# Patient Record
Sex: Female | Born: 1975 | Race: White | Hispanic: No | Smoking: Former smoker
Health system: Southern US, Academic
[De-identification: ages and names within clinical notes are randomized; demographics above are authoritative.]

## PROBLEM LIST (undated history)

## (undated) DIAGNOSIS — F419 Anxiety disorder, unspecified: Secondary | ICD-10-CM

## (undated) DIAGNOSIS — E7800 Pure hypercholesterolemia, unspecified: Secondary | ICD-10-CM

## (undated) DIAGNOSIS — E119 Type 2 diabetes mellitus without complications: Secondary | ICD-10-CM

## (undated) DIAGNOSIS — G4730 Sleep apnea, unspecified: Secondary | ICD-10-CM

## (undated) DIAGNOSIS — E781 Pure hyperglyceridemia: Secondary | ICD-10-CM

## (undated) DIAGNOSIS — M545 Low back pain, unspecified: Secondary | ICD-10-CM

## (undated) DIAGNOSIS — F319 Bipolar disorder, unspecified: Secondary | ICD-10-CM

## (undated) DIAGNOSIS — I10 Essential (primary) hypertension: Secondary | ICD-10-CM

## (undated) DIAGNOSIS — F32A Depression, unspecified: Secondary | ICD-10-CM

## (undated) DIAGNOSIS — K219 Gastro-esophageal reflux disease without esophagitis: Secondary | ICD-10-CM

## (undated) DIAGNOSIS — K5900 Constipation, unspecified: Secondary | ICD-10-CM

## (undated) HISTORY — PX: HX COLONOSCOPY: 2100001147

## (undated) HISTORY — PX: HX HYSTERECTOMY: SHX81

## (undated) HISTORY — PX: ESOPHAGOGASTRODUODENOSCOPY: SHX1529

## (undated) HISTORY — PX: HX TUBAL LIGATION: SHX77

## (undated) HISTORY — DX: Anxiety disorder, unspecified: F41.9

## (undated) HISTORY — PX: HX APPENDECTOMY: SHX54

---

## 1994-09-15 ENCOUNTER — Other Ambulatory Visit (HOSPITAL_COMMUNITY): Payer: Self-pay | Admitting: EXTERNAL

## 2015-09-01 DIAGNOSIS — F32A Depression, unspecified: Secondary | ICD-10-CM | POA: Insufficient documentation

## 2016-10-11 ENCOUNTER — Other Ambulatory Visit (HOSPITAL_COMMUNITY): Payer: Self-pay

## 2020-04-30 ENCOUNTER — Other Ambulatory Visit (HOSPITAL_COMMUNITY): Payer: Self-pay

## 2020-04-30 IMAGING — MR MRI LUMBAR SPINE WITHOUT CONTRAST
6 series · 48 of 48 positions shown · IV contrast (gadolinium)
Comparison: None available.

﻿EXAM:  MRI LUMBAR SPINE WITHOUT CONTRAST
INDICATION: Lower back pain.
TECHNIQUE: Multiplanar multisequential MRI of the lumbar spine was performed without gadolinium contrast. A non conventional body coil was utilized as the patient refused standard coil.

[Series 4: T2 · coronal · 4.0mm · 1.79mm/px · 8 of 20 slices shown (1 of 3)]
[im 1/20]
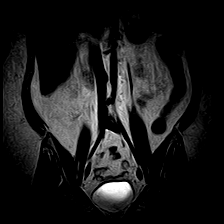
[im 3/20]
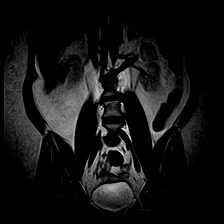
[im 6/20]
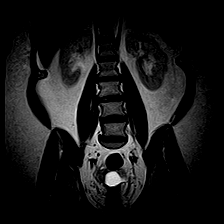
[im 9/20]
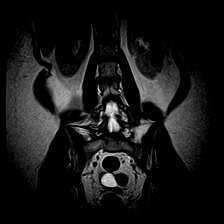
[im 11/20]
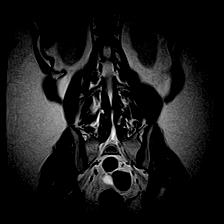
[im 14/20]
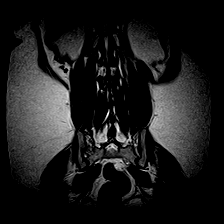
[im 17/20]
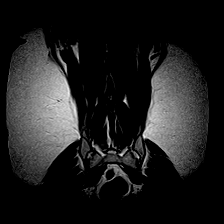
[im 20/20]
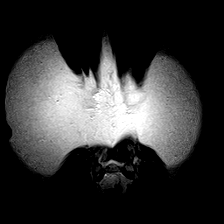

[Series 5: T2 · sagittal · 5.0mm · 1.00mm/px · 6 of 13 slices shown (2 of 3)]
[im 1/13]
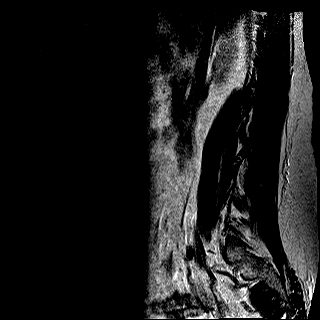
[im 3/13]
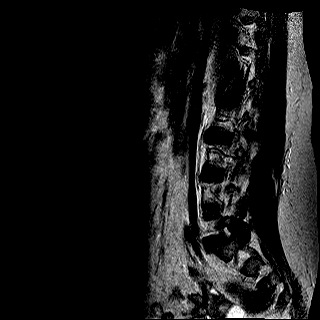
[im 5/13]
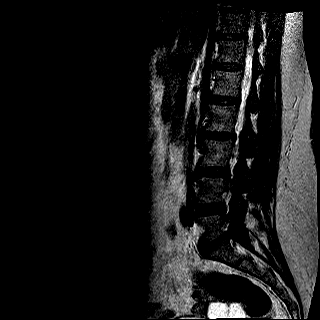
[im 8/13]
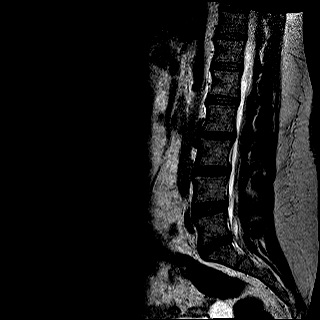
[im 10/13]
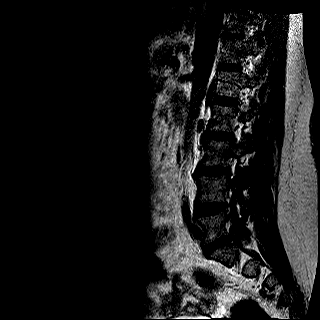
[im 13/13]
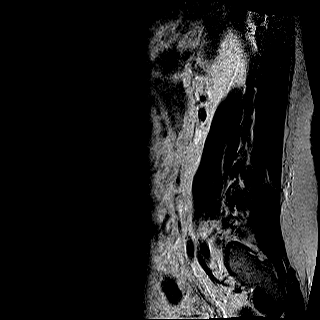

[Series 6: T1 · sagittal · 5.0mm · 1.00mm/px · 6 of 13 slices shown (1 of 2)]
[im 1/13]
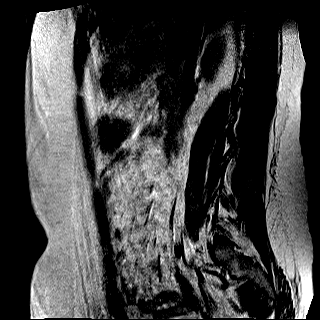
[im 3/13]
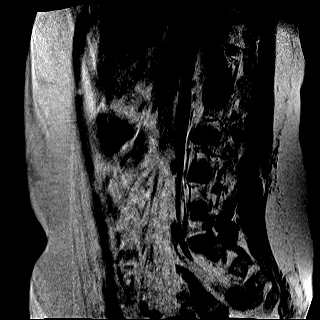
[im 5/13]
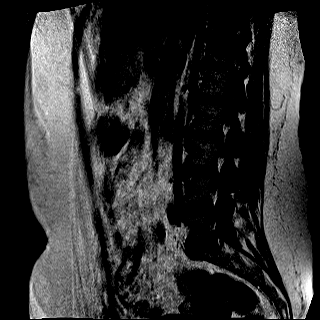
[im 8/13]
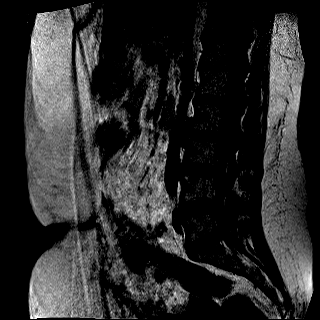
[im 10/13]
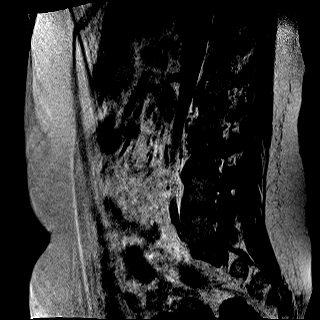
[im 13/13]
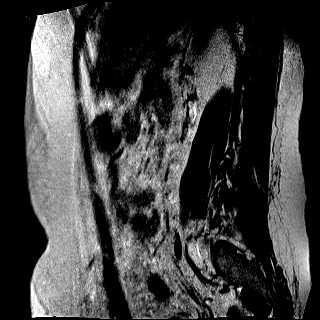

[Series 7: STIR · sagittal · 5.0mm · 1.25mm/px · 6 of 13 slices shown]
[im 1/13]
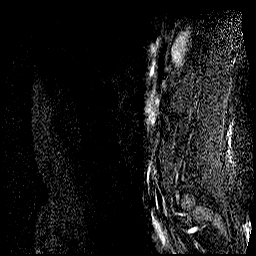
[im 3/13]
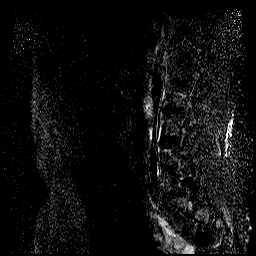
[im 5/13]
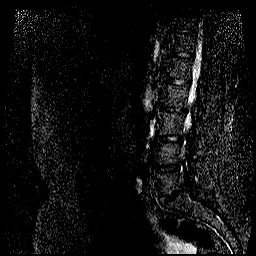
[im 8/13]
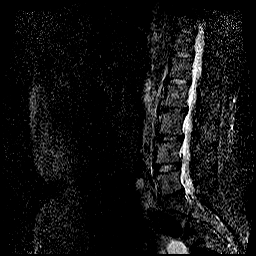
[im 10/13]
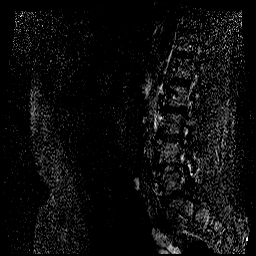
[im 13/13]
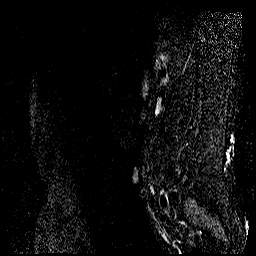

[Series 8: T2 · axial · 5.0mm · 0.89mm/px · z∈[-82,+110]mm · 11 of 25 slices shown (3 of 3)]
[im 1/25]
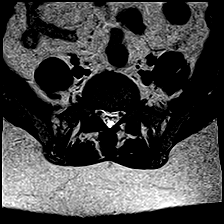
[im 3/25]
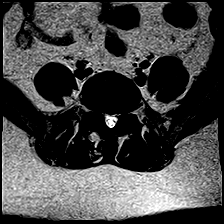
[im 5/25]
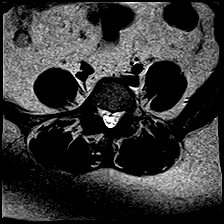
[im 8/25]
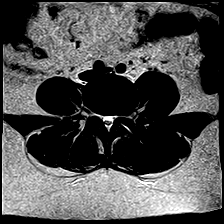
[im 10/25]
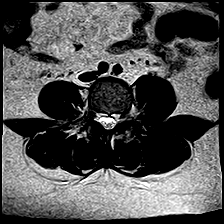
[im 13/25]
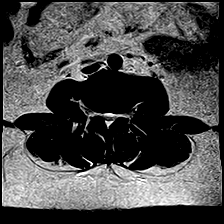
[im 15/25]
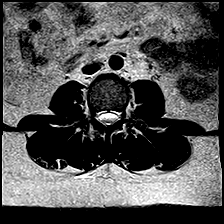
[im 17/25]
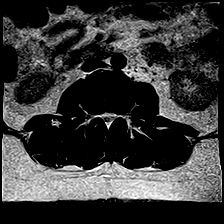
[im 20/25]
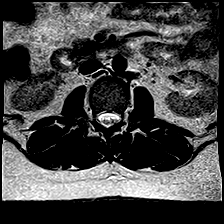
[im 22/25]
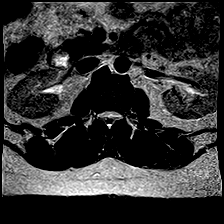
[im 25/25]
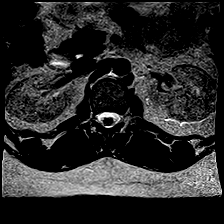

[Series 9: T1 · axial · 5.0mm · 0.89mm/px · z∈[-82,+110]mm · 11 of 25 slices shown (2 of 2)]
[im 1/25]
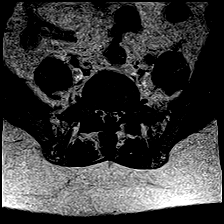
[im 3/25]
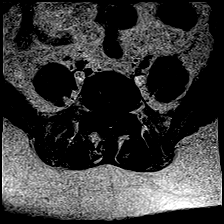
[im 5/25]
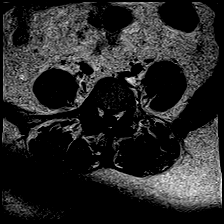
[im 8/25]
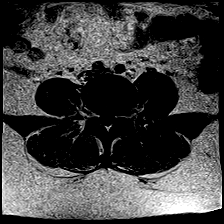
[im 10/25]
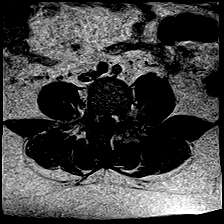
[im 13/25]
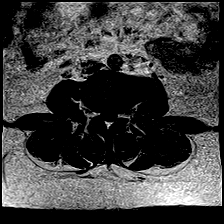
[im 15/25]
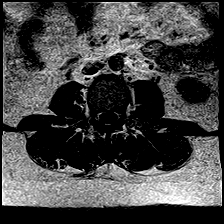
[im 17/25]
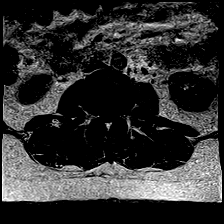
[im 20/25]
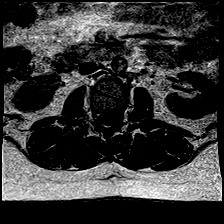
[im 22/25]
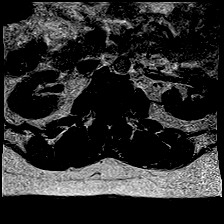
[im 25/25]
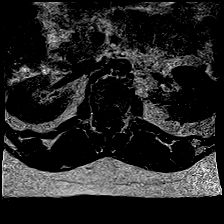

[48 of 48 positions shown; findings below may reference images not displayed]

FINDINGS: Vertebral bodies are normal in height, alignment and signal intensity. There is no acute fracture or subluxation. Distal spinal cord is normal in signal intensity and terminates normally at L1-2 disc space level. Spinal canal is congenitally narrow.

L1-2 level is unremarkable.

At L2-3 level, there is a small broad-based central disc bulge resulting in mild-to-moderate spinal stenosis. There is mild bilateral neural foraminal stenosis from facet arthropathy and bulging annulus without nerve root impingement.

At L3-4 level, there is a small broad-based central disc bulge resulting in mild spinal stenosis. There is mild bilateral neural foraminal stenosis from facet arthropathy and bulging annulus without nerve root impingement.

At L4-5 level, there is moderate left and mild right neural foraminal stenosis from facet arthropathy and bulging annulus.

At L5-S1 level, there is a minimal bulging annulus, minimally abutting the ventral thecal sac. There is moderate to severe bilateral neural foraminal stenosis from facet arthropathy and bulging annulus.

Paraspinal soft tissues are unremarkable.
IMPRESSION: 1. Mild-to-moderate spinal stenosis at L2-3 level and mild spinal stenosis at L3-4 level from small central disc bulges. 

2. Multilevel neural foraminal stenosis as detailed above.

## 2021-12-18 ENCOUNTER — Emergency Department (HOSPITAL_COMMUNITY): Payer: Medicaid Other

## 2021-12-18 ENCOUNTER — Encounter (HOSPITAL_COMMUNITY): Payer: Self-pay

## 2021-12-18 ENCOUNTER — Emergency Department
Admission: EM | Admit: 2021-12-18 | Discharge: 2021-12-18 | Disposition: A | Discharge status: Home / Self Care | Payer: Medicaid Other | Attending: Family | Admitting: Family

## 2021-12-18 ENCOUNTER — Other Ambulatory Visit: Payer: Self-pay

## 2021-12-18 DIAGNOSIS — Z6832 Body mass index (BMI) 32.0-32.9, adult: Secondary | ICD-10-CM

## 2021-12-18 DIAGNOSIS — R55 Syncope and collapse: Secondary | ICD-10-CM

## 2021-12-18 DIAGNOSIS — K5900 Constipation, unspecified: Secondary | ICD-10-CM | POA: Insufficient documentation

## 2021-12-18 DIAGNOSIS — K648 Other hemorrhoids: Secondary | ICD-10-CM | POA: Insufficient documentation

## 2021-12-18 DIAGNOSIS — R110 Nausea: Secondary | ICD-10-CM

## 2021-12-18 DIAGNOSIS — R1084 Generalized abdominal pain: Secondary | ICD-10-CM

## 2021-12-18 HISTORY — DX: Bipolar disorder, unspecified: F31.9

## 2021-12-18 HISTORY — DX: Essential (primary) hypertension: I10

## 2021-12-18 HISTORY — DX: Pure hyperglyceridemia: E78.1

## 2021-12-18 HISTORY — DX: Type 2 diabetes mellitus without complications: E11.9

## 2021-12-18 LAB — CBC WITH DIFF
BASOPHIL #: 0 10*3/uL (ref 0.00–0.30)
BASOPHIL %: 1 % (ref 0–3)
EOSINOPHIL #: 0.1 10*3/uL (ref 0.00–0.80)
EOSINOPHIL %: 1 % (ref 0–7)
HCT: 37.1 % (ref 37.0–47.0)
HGB: 12.6 g/dL (ref 12.5–16.0)
LYMPHOCYTE #: 2.4 10*3/uL (ref 1.10–5.00)
LYMPHOCYTE %: 34 % (ref 25–45)
MCH: 27.7 pg (ref 27.0–32.0)
MCHC: 34 g/dL (ref 32.0–36.0)
MCV: 81.5 fL (ref 78.0–99.0)
MONOCYTE #: 0.4 10*3/uL (ref 0.00–1.30)
MONOCYTE %: 6 % (ref 0–12)
MPV: 6.6 fL — ABNORMAL LOW (ref 7.4–10.4)
NEUTROPHIL #: 4.1 10*3/uL (ref 1.80–8.40)
NEUTROPHIL %: 59 % (ref 40–76)
PLATELETS: 333 10*3/uL (ref 140–440)
RBC: 4.55 10*6/uL (ref 4.20–5.40)
RDW: 13.6 % (ref 11.6–14.8)
WBC: 6.9 10*3/uL (ref 4.0–10.5)
WBCS UNCORRECTED: 6.9 10*3/uL

## 2021-12-18 LAB — TYPE AND SCREEN
ABO/RH(D): AB POS
ANTIBODY SCREEN: NEGATIVE

## 2021-12-18 LAB — URINALYSIS, MACROSCOPIC
BILIRUBIN: NEGATIVE mg/dL
BLOOD: NEGATIVE mg/dL
GLUCOSE: NEGATIVE mg/dL
LEUKOCYTES: NEGATIVE WBCs/uL
NITRITE: NEGATIVE
PH: 6 (ref 5.0–9.0)
PROTEIN: NEGATIVE mg/dL
SPECIFIC GRAVITY: 1.011 (ref 1.002–1.030)
UROBILINOGEN: NORMAL mg/dL

## 2021-12-18 LAB — URINALYSIS, MICROSCOPIC
HYALINE CASTS: 24 /lpf — ABNORMAL HIGH (ref <0)
RBCS: 2 /hpf (ref <4)
SQUAMOUS EPITHELIAL: 1 /hpf (ref <28)
WBCS: 1 /hpf (ref <6)

## 2021-12-18 LAB — BASIC METABOLIC PANEL
ANION GAP: 12 mmol/L (ref 10–20)
BUN/CREA RATIO: 13 (ref 6–22)
BUN: 20 mg/dL (ref 7–25)
CALCIUM: 9.2 mg/dL (ref 8.6–10.3)
CHLORIDE: 95 mmol/L — ABNORMAL LOW (ref 98–107)
CO2 TOTAL: 22 mmol/L (ref 21–31)
CREATININE: 1.56 mg/dL — ABNORMAL HIGH (ref 0.60–1.30)
ESTIMATED GFR: 41 mL/min/{1.73_m2} — ABNORMAL LOW (ref >59)
GLUCOSE: 92 mg/dL (ref 74–109)
OSMOLALITY, CALCULATED: 261 mOsm/kg — ABNORMAL LOW (ref 270–290)
POTASSIUM: 4.8 mmol/L (ref 3.5–5.1)
SODIUM: 129 mmol/L — ABNORMAL LOW (ref 136–145)

## 2021-12-18 LAB — LIPASE: LIPASE: 44 U/L (ref 11–82)

## 2021-12-18 LAB — THYROID STIMULATING HORMONE (SENSITIVE TSH): TSH: 5.071 u[IU]/mL (ref 0.450–5.330)

## 2021-12-18 LAB — LACTIC ACID LEVEL W/ REFLEX FOR LEVEL >2.0: LACTIC ACID: 0.7 mmol/L (ref 0.5–2.2)

## 2021-12-18 LAB — PT/INR
INR: 1.12 (ref <=5.00)
PROTHROMBIN TIME: 13 seconds — ABNORMAL HIGH (ref 9.8–12.7)

## 2021-12-18 MED ORDER — MAGNESIUM CITRATE ORAL SOLUTION
296.0000 mL | Freq: Once | ORAL | 0 refills | Status: AC
Start: 2021-12-18 — End: 2021-12-18

## 2021-12-18 MED ORDER — SODIUM CHLORIDE 0.9 % IV BOLUS
1000.0000 mL | INJECTION | Status: AC
Start: 2021-12-18 — End: 2021-12-18
  Administered 2021-12-18: 1000 mL via INTRAVENOUS
  Administered 2021-12-18: 0 mL via INTRAVENOUS

## 2021-12-18 NOTE — ED Triage Notes (Signed)
States syncopal episode last Friday while using the restroom, states blood in the stool on the toilet and in the bathroom, pt states she woke up on the flood.. Pt states that she has a feeling of felling full and unable to eat/use the restroom. States nausea. Reports mid abd pain.

## 2021-12-18 NOTE — ED APP Handoff Note (Signed)
Marland Kitchen     [facility]  Emergency Department  Provider in Triage Note    Name: Miranda Young  Age: 46 y.o.  Gender: female     Subjective:   Miranda Young is a 46 y.o. female who presents with complaint of Abdominal Pain  .  Patient here with complaint of diffuse abdominal pain and bloating.  Also complaining of nausea and constipation.  Reports last week she started having the rectal bleeding.  Reports bright red in color.  States she has been having intermittent bleeding ever since.  Denies any pertinent GI history or surgery.  Does report history of hysterectomy.    Objective:   Filed Vitals:    12/18/21 1531   BP: (!) 162/102   Pulse: (!) 119   Resp: 18   Temp: 37.2 C (98.9 F)   SpO2: 99%      Focused Physical Exam shows diffuse tenderness    Assessment:  A medical screening exam was completed.  This patient is a 46 y.o. female with initial findings showing abd pain    Plan:  Please see initial orders and work-up below.  This is to be continued with full evaluation in the main Emergency Department.     No current facility-administered medications for this encounter.     Results for orders placed or performed during the hospital encounter of 12/18/21 (from the past 24 hour(s))   CBC/DIFF    Narrative    The following orders were created for panel order CBC/DIFF.  Procedure                               Abnormality         Status                     ---------                               -----------         ------                     CBC WITH ASNK[[id number]]                                                                 Please view results for these tests on the individual orders.   URINALYSIS, MACROSCOPIC AND MICROSCOPIC W/CULTURE REFLEX    Specimen: Urine, Clean Catch    Narrative    The following orders were created for panel order URINALYSIS, MACROSCOPIC AND MICROSCOPIC W/CULTURE REFLEX.  Procedure                               Abnormality         Status                     ---------                                -----------         ------  URINALYSIS, MACROSCOPIC[[id number]]                                                     URINALYSIS, MICROSCOPIC[[id number]]                                                       Please view results for these tests on the individual orders.        Sherlie Ban, FNP-BC  12/18/2021, 15:35

## 2021-12-18 NOTE — ED Provider Notes (Signed)
[facility]  ED Primary Provider Note  History of Present Illness   Chief Complaint   Patient presents with   . Abdominal Pain     MALCOLM HETZ is a 46 y.o. female who had concerns including Abdominal Pain.  Arrival: The patient arrived by Car    46 year old female presents to the ER complaining abdominal pain and constipation x4 days.  Patient states she was on the toilet bearing down due to constipation when she passed out and noticed bright red blood on the toilet.  Patient saw her primary care provider and they describe her Linzess without relief.  Patient states she had 1 hard bowel movements since then and is now complaining of abdominal fullness and inability to have bowel movement.  Patient states she is able to pass gas and denies localized abdominal pain.        Review of Systems   Pertinent positive and negative ROS as per HPI.  Historical Data   History Reviewed This Encounter:      Physical Exam   ED Triage Vitals [12/18/21 1531]   BP (Non-Invasive) (!) 162/102   Heart Rate (!) 119   Respiratory Rate 18   Temperature 37.2 C (98.9 F)   SpO2 99 %   Weight 89.4 kg (197 lb)   Height 1.651 m ('5\' 5"'$ )     Physical Exam  Vitals and nursing note reviewed.   Constitutional:       General: She is not in acute distress.     Appearance: She is well-developed.   HENT:      Head: Normocephalic and atraumatic.   Eyes:      Conjunctiva/sclera: Conjunctivae normal.   Cardiovascular:      Rate and Rhythm: Normal rate and regular rhythm.      Heart sounds: No murmur heard.  Pulmonary:      Effort: Pulmonary effort is normal. No respiratory distress.      Breath sounds: Normal breath sounds.   Abdominal:      Palpations: Abdomen is soft.      Tenderness: There is no abdominal tenderness.   Musculoskeletal:         General: No swelling.      Cervical back: Neck supple.   Skin:     General: Skin is warm and dry.      Capillary Refill: Capillary refill takes less than 2 seconds.    Neurological:      Mental Status: She is alert.   Psychiatric:         Mood and Affect: Mood normal.       Patient Data     Labs Ordered/Reviewed   BASIC METABOLIC PANEL - Abnormal; Notable for the following components:       Result Value    SODIUM 129 (*)     CHLORIDE 95 (*)     CREATININE 1.56 (*)     ESTIMATED GFR 41 (*)     OSMOLALITY, CALCULATED 261 (*)     All other components within normal limits    Narrative:     Estimated Glomerular Filtration Rate (eGFR) is calculated using the CKD-EPI (2021) equation, intended for patients 42 years of age and older. If gender is not documented or "unknown", there will be no eGFR calculation.   PT/INR - Abnormal; Notable for the following components:    PROTHROMBIN TIME 13.0 (*)     All other components within normal limits    Narrative:  INR OF 2.0-3.0  RECOMMENDED FOR: PROPHYLAXIS/TREATMENT OF VENEOUS THROMBOSIS, PULMONARY EMBOLISM, PREVENTION OF SYSTEMIC EMBOLISM FROM ATRIAL FIBRILATION, MYOCARDIAL INFARCTION.    INR OF 2.5-3.5  RECOMMENDED FOR MECHANICAL PROSTHETIC HEART VALVES, RECURRENT SYSTEMIC EMBOLISM, RECURRENT MYOCARDIAL INFARCTION.     CBC WITH DIFF - Abnormal; Notable for the following components:    MPV 6.6 (*)     All other components within normal limits   URINALYSIS, MICROSCOPIC - Abnormal; Notable for the following components:    BACTERIA Rare (*)     MUCOUS Rare (*)     HYALINE CASTS 24 (*)     All other components within normal limits    Narrative:     Urine Culture Recommended   LACTIC ACID LEVEL W/ REFLEX FOR LEVEL >2.0 - Normal   LIPASE - Normal   THYROID STIMULATING HORMONE (SENSITIVE TSH) - Normal   URINALYSIS, MACROSCOPIC - Normal    Narrative:     Urine Culture Recommended   OCCULT BLOOD (PRN ED USE ONLY)   CBC/DIFF    Narrative:     The following orders were created for panel order CBC/DIFF.  Procedure                               Abnormality         Status                     ---------                               -----------          ------                     CBC WITH MAUQ[[id number]]                Abnormal            Final result                 Please view results for these tests on the individual orders.   URINALYSIS, MACROSCOPIC AND MICROSCOPIC W/CULTURE REFLEX    Narrative:     The following orders were created for panel order URINALYSIS, MACROSCOPIC AND MICROSCOPIC W/CULTURE REFLEX.  Procedure                               Abnormality         Status                     ---------                               -----------         ------                     URINALYSIS, MACROSCOPIC[[id number]]      Normal              Final result               URINALYSIS, MICROSCOPIC[[id number]]      Abnormal            Final result  Please view results for these tests on the individual orders.   TYPE AND SCREEN     CT ABDOMEN PELVIS WO IV CONTRAST   Final Result by Edi, Radresults In (04/20 1634)   Impression: No acute abnormality suggested. Minor findings as above.         One or more dose reduction techniques were used (e.g., Automated exposure control, adjustment of the mA and/or kV according to patient size, use of iterative reconstruction technique).         Radiologist location ID: [place] [facility]        Medical Decision Making  46 year old female presents to the ER complaining abdominal pain and constipation x4 days.  Patient states she was on the toilet bearing down due to constipation when she passed out and noticed bright red blood on the toilet.  Patient saw her primary care provider and they describe her Linzess without relief.  Patient states she had 1 hard bowel movements since then and is now complaining of abdominal fullness and inability to have bowel movement.  Patient states she is able to pass gas and denies localized abdominal pain.  See physical exam.  Rectal exam included 1 external 1 internal hemorrhoid without bleeding.  Upon review of CT and labs, no acute abnormalities noted other than  low sodium.  Patient had it replaced with a L of normal saline patient denies abdominal pain at this time.  Patient discharged with prescription for Mag citrate and instructed how to use enemas at home.  Patient to return if worsening of symptoms.    Amount and/or Complexity of Data Reviewed  Labs: ordered.      Risk  OTC drugs.               Medications Administered in the ED   NS bolus infusion 1,000 mL (1,000 mL Intravenous New Bag/New Syringe 12/18/21 1751)     Clinical Impression   Constipation, unspecified constipation type (Primary)       Disposition: Discharged

## 2021-12-18 NOTE — ED Nurses Note (Signed)
Patient discharged home.  AVS reviewed with patient.  A written copy of the AVS and discharge instructions was given to the patient.  Questions sufficiently answered as needed.  Patient encouraged to follow up with PCP as indicated.  In the event of an emergency, patient/care giver instructed to call 911 or go to the nearest emergency room. IV removed and pressure dressing applied. Pt left department ambulatory.

## 2021-12-29 ENCOUNTER — Other Ambulatory Visit: Payer: Self-pay

## 2022-01-16 ENCOUNTER — Encounter (INDEPENDENT_AMBULATORY_CARE_PROVIDER_SITE_OTHER): Payer: Self-pay | Admitting: Surgery

## 2022-01-22 ENCOUNTER — Encounter (INDEPENDENT_AMBULATORY_CARE_PROVIDER_SITE_OTHER): Payer: Self-pay | Admitting: Surgery

## 2022-01-22 ENCOUNTER — Other Ambulatory Visit: Payer: Self-pay

## 2022-01-22 ENCOUNTER — Ambulatory Visit (INDEPENDENT_AMBULATORY_CARE_PROVIDER_SITE_OTHER): Payer: Medicaid Other | Admitting: Surgery

## 2022-01-22 VITALS — BP 137/83 | HR 87 | Temp 97.1°F | Ht 65.0 in | Wt 197.6 lb

## 2022-01-22 DIAGNOSIS — K219 Gastro-esophageal reflux disease without esophagitis: Secondary | ICD-10-CM

## 2022-01-22 DIAGNOSIS — K5900 Constipation, unspecified: Secondary | ICD-10-CM

## 2022-01-22 DIAGNOSIS — R112 Nausea with vomiting, unspecified: Secondary | ICD-10-CM

## 2022-01-22 DIAGNOSIS — R1013 Epigastric pain: Secondary | ICD-10-CM

## 2022-01-22 DIAGNOSIS — Z6832 Body mass index (BMI) 32.0-32.9, adult: Secondary | ICD-10-CM

## 2022-01-22 DIAGNOSIS — R194 Change in bowel habit: Secondary | ICD-10-CM

## 2022-01-22 MED ORDER — PEG 3350-ELECTROLYTES 236 GRAM-22.74 GRAM-6.74 GRAM-5.86 GRAM SOLUTION
4.0000 L | Freq: Once | ORAL | 0 refills | Status: DC
Start: 2022-01-22 — End: 2022-02-10

## 2022-01-24 ENCOUNTER — Encounter (INDEPENDENT_AMBULATORY_CARE_PROVIDER_SITE_OTHER): Payer: Self-pay | Admitting: Surgery

## 2022-01-24 NOTE — H&P (View-Only) (Signed)
Office History and Physical      Reason for Visit: Abdominal Pain and Nausea    History of Present Illness  Miranda Young presents as a referral by Chauncy Lean, FNP-C for evaluation of Abdominal pain with significant constipation and associated nausea with vomiting with a change in bowel habits. Symptoms have been worse over the last month or so. Describes an episode of 15 days without a bowel movement. There was associated nausea with vomiting. No improvement using Linzess with a described unremarkable CT scan of the abdomen and pelvis. No previous endoscopic workup. No change in medications. No unintentional weight loss.     I have reviewed the patient's provided medical records and diagnostic testing including laboratory values, imaging results, documented encounters and providers notes with all pertinent information noted with respect to today's evaluation serving as unique tests and sources as a component of the medical decision making process for this encounter relevant to the patients independent evaluation by me today.        Patient Data  Patient History  Past Medical History:   Diagnosis Date   . Anxiety    . Bipolar disorder, unspecified (CMS [place])    . Diabetes mellitus, type 2 (CMS [facility])    . High triglycerides    . HTN (hypertension)          Past Surgical History:   Procedure Laterality Date   . HX APPENDECTOMY     . HX HYSTERECTOMY     . HX TUBAL LIGATION           Current Outpatient Medications   Medication Sig   . ACCU-CHEK GUIDE TEST STRIPS Does not apply Strip CHECK BLOOD SUGAR ONCE A DAY AND AS NEEDED   . atorvastatin (LIPITOR) 40 mg Oral Tablet Take 1 Tablet (40 mg total) by mouth Every evening   . busPIRone (BUSPAR) 15 mg Oral Tablet Take 1 Tablet (15 mg total) by mouth Three times a day   . ezetimibe (ZETIA) 10 mg Oral Tablet Take 1 Tablet (10 mg total) by mouth Every evening   . hydroCHLOROthiazide (HYDRODIURIL) 25 mg Oral Tablet Take 1 Tablet (25 mg total) by mouth Once a day   .  hydrOXYzine HCL (ATARAX) 50 mg Oral Tablet Take 1 Tablet (50 mg total) by mouth Four times a day   . hydrOXYzine pamoate (VISTARIL) 50 mg Oral Capsule Take 1 Capsule (50 mg total) by mouth Four times a day   . Lisinopril (PRINIVIL) 30 mg Oral Tablet Take 1 Tablet (30 mg total) by mouth Once a day   . LORazepam (ATIVAN) 1 mg Oral Tablet Take 1 Tablet (1 mg total) by mouth Once per day as needed for Anxiety   . metFORMIN (GLUCOPHAGE) 500 mg Oral Tablet Take 1 Tablet (500 mg total) by mouth Every morning with breakfast   . metoprolol succinate (TOPROL-XL) 25 mg Oral Tablet Sustained Release 24 hr Take 1 Tablet (25 mg total) by mouth Once a day   . metoprolol tartrate (LOPRESSOR) 25 mg Oral Tablet Take 1 Tablet (25 mg total) by mouth Once a day   . Mirtazapine (REMERON) 45 mg Oral Tablet Take 1 Tablet (45 mg total) by mouth Every night   . omeprazole (PRILOSEC) 20 mg Oral Capsule, Delayed Release(E.C.) Take 1 Capsule (20 mg total) by mouth Once a day   . ondansetron (ZOFRAN) 4 mg Oral Tablet Take 1 Tablet (4 mg total) by mouth Once per day as needed   . OXcarbazepine (  TRILEPTAL) 300 mg Oral Tablet Take 1 Tablet (300 mg total) by mouth Three times a day   . polyethylene glycol (MIRALAX) 17 gram/dose Oral Powder Take by mouth Once a day   . QUEtiapine (SEROQUEL) 400 mg Oral Tablet Take 1 Tablet (400 mg total) by mouth Every night   . QUEtiapine (SEROQUEL) 50 mg Oral Tablet Take 1 Tablet (50 mg total) by mouth Twice daily     Allergies   Allergen Reactions   . Amoxicillin    . Iron Nausea/ Vomiting     Family Medical History:    None         Social History     Tobacco Use   . Smoking status: Former     Types: Cigarettes   . Smokeless tobacco: Never   Substance Use Topics   . Alcohol use: Not Currently   . Drug use: Not Currently        The above documented section regarding past medical, past surgical, family, and social history ([place]) has been reviewed and considered and to the best of my knowledge represents a valid and  accurate reflection of the patient's previous pertinent experiences documented by multiple providers and participants of the EMR.I cannot attest to all entries but do no recognize any gross inaccuracies as the data is a common field across all providers  Further history pertinent to the current encounter will be found as referenced       Physical Examination:    Vitals:    01/22/22 0828   BP: 137/83   Pulse: 87   Temp: 36.2 C (97.1 F)   SpO2: 98%   Weight: 89.6 kg (197 lb 9.6 oz)   Height: 1.651 m (5\' 5" )   BMI: 32.95      General: appropriate for age. in no acute distress.    HEENT: Atraumatic, Normocephalic.    Lungs: Nonlabored breathing with symmetric expansion    Heart:Regular wth respect to rate and rythmn.    Abdomen:Soft. Nontender. Nondistended     Psychiatric: Alert and oriented to person, place, and time. affect appropriate    Skin: No rashes or obvious skin lesions         Diagnosis:    ICD-10-CM    1. Epigastric pain  R10.13       2. Change in bowel habits  R19.4       3. Gastroesophageal reflux disease, unspecified whether esophagitis present  K21.9       4. Nausea and vomiting, unspecified vomiting type  R11.2       5. Constipation, unspecified constipation type  K59.00           Plan:    Discussed indications, risks and benefits of colonoscopy and upper endoscopy with the patient. Discussed the possibility of polypectomy, biopsies, and possible repeat examinations. Risks include bleeding, sedation risks, possibility of missed diagnosis of polyp or malignancy, and remote possibilities of perforation and death. All questions were answered and informed consent was clearly obtained      This note may have been partially generated using MModal Fluency Direct system, and there may be some incorrect words, spellings, and punctuation that were not noted in checking the note before saving, though effort was made to avoid such errors.    Reinaldo Meeker MD FACS RVT  [facility]

## 2022-01-24 NOTE — H&P (Signed)
Office History and Physical      Reason for Visit: Abdominal Pain and Nausea    History of Present Illness  Ms. Dasher presents as a referral by Chauncy Lean, FNP-C for evaluation of Abdominal pain with significant constipation and associated nausea with vomiting with a change in bowel habits. Symptoms have been worse over the last month or so. Describes an episode of 15 days without a bowel movement. There was associated nausea with vomiting. No improvement using Linzess with a described unremarkable CT scan of the abdomen and pelvis. No previous endoscopic workup. No change in medications. No unintentional weight loss.     I have reviewed the patient's provided medical records and diagnostic testing including laboratory values, imaging results, documented encounters and providers notes with all pertinent information noted with respect to today's evaluation serving as unique tests and sources as a component of the medical decision making process for this encounter relevant to the patients independent evaluation by me today.        Patient Data  Patient History  Past Medical History:   Diagnosis Date   . Anxiety    . Bipolar disorder, unspecified (CMS [place])    . Diabetes mellitus, type 2 (CMS [facility])    . High triglycerides    . HTN (hypertension)          Past Surgical History:   Procedure Laterality Date   . HX APPENDECTOMY     . HX HYSTERECTOMY     . HX TUBAL LIGATION           Current Outpatient Medications   Medication Sig   . ACCU-CHEK GUIDE TEST STRIPS Does not apply Strip CHECK BLOOD SUGAR ONCE A DAY AND AS NEEDED   . atorvastatin (LIPITOR) 40 mg Oral Tablet Take 1 Tablet (40 mg total) by mouth Every evening   . busPIRone (BUSPAR) 15 mg Oral Tablet Take 1 Tablet (15 mg total) by mouth Three times a day   . ezetimibe (ZETIA) 10 mg Oral Tablet Take 1 Tablet (10 mg total) by mouth Every evening   . hydroCHLOROthiazide (HYDRODIURIL) 25 mg Oral Tablet Take 1 Tablet (25 mg total) by mouth Once a day   .  hydrOXYzine HCL (ATARAX) 50 mg Oral Tablet Take 1 Tablet (50 mg total) by mouth Four times a day   . hydrOXYzine pamoate (VISTARIL) 50 mg Oral Capsule Take 1 Capsule (50 mg total) by mouth Four times a day   . Lisinopril (PRINIVIL) 30 mg Oral Tablet Take 1 Tablet (30 mg total) by mouth Once a day   . LORazepam (ATIVAN) 1 mg Oral Tablet Take 1 Tablet (1 mg total) by mouth Once per day as needed for Anxiety   . metFORMIN (GLUCOPHAGE) 500 mg Oral Tablet Take 1 Tablet (500 mg total) by mouth Every morning with breakfast   . metoprolol succinate (TOPROL-XL) 25 mg Oral Tablet Sustained Release 24 hr Take 1 Tablet (25 mg total) by mouth Once a day   . metoprolol tartrate (LOPRESSOR) 25 mg Oral Tablet Take 1 Tablet (25 mg total) by mouth Once a day   . Mirtazapine (REMERON) 45 mg Oral Tablet Take 1 Tablet (45 mg total) by mouth Every night   . omeprazole (PRILOSEC) 20 mg Oral Capsule, Delayed Release(E.C.) Take 1 Capsule (20 mg total) by mouth Once a day   . ondansetron (ZOFRAN) 4 mg Oral Tablet Take 1 Tablet (4 mg total) by mouth Once per day as needed   . OXcarbazepine (  TRILEPTAL) 300 mg Oral Tablet Take 1 Tablet (300 mg total) by mouth Three times a day   . polyethylene glycol (MIRALAX) 17 gram/dose Oral Powder Take by mouth Once a day   . QUEtiapine (SEROQUEL) 400 mg Oral Tablet Take 1 Tablet (400 mg total) by mouth Every night   . QUEtiapine (SEROQUEL) 50 mg Oral Tablet Take 1 Tablet (50 mg total) by mouth Twice daily     Allergies   Allergen Reactions   . Amoxicillin    . Iron Nausea/ Vomiting     Family Medical History:    None         Social History     Tobacco Use   . Smoking status: Former     Types: Cigarettes   . Smokeless tobacco: Never   Substance Use Topics   . Alcohol use: Not Currently   . Drug use: Not Currently        The above documented section regarding past medical, past surgical, family, and social history ([place]) has been reviewed and considered and to the best of my knowledge represents a valid and  accurate reflection of the patient's previous pertinent experiences documented by multiple providers and participants of the EMR.I cannot attest to all entries but do no recognize any gross inaccuracies as the data is a common field across all providers  Further history pertinent to the current encounter will be found as referenced       Physical Examination:    Vitals:    01/22/22 0828   BP: 137/83   Pulse: 87   Temp: 36.2 C (97.1 F)   SpO2: 98%   Weight: 89.6 kg (197 lb 9.6 oz)   Height: 1.651 m (5\' 5" )   BMI: 32.95      General: appropriate for age. in no acute distress.    HEENT: Atraumatic, Normocephalic.    Lungs: Nonlabored breathing with symmetric expansion    Heart:Regular wth respect to rate and rythmn.    Abdomen:Soft. Nontender. Nondistended     Psychiatric: Alert and oriented to person, place, and time. affect appropriate    Skin: No rashes or obvious skin lesions         Diagnosis:    ICD-10-CM    1. Epigastric pain  R10.13       2. Change in bowel habits  R19.4       3. Gastroesophageal reflux disease, unspecified whether esophagitis present  K21.9       4. Nausea and vomiting, unspecified vomiting type  R11.2       5. Constipation, unspecified constipation type  K59.00           Plan:    Discussed indications, risks and benefits of colonoscopy and upper endoscopy with the patient. Discussed the possibility of polypectomy, biopsies, and possible repeat examinations. Risks include bleeding, sedation risks, possibility of missed diagnosis of polyp or malignancy, and remote possibilities of perforation and death. All questions were answered and informed consent was clearly obtained      This note may have been partially generated using MModal Fluency Direct system, and there may be some incorrect words, spellings, and punctuation that were not noted in checking the note before saving, though effort was made to avoid such errors.    Reinaldo Meeker MD FACS RVT  [facility]

## 2022-02-10 ENCOUNTER — Encounter (HOSPITAL_COMMUNITY): Disposition: A | Discharge status: Home / Self Care | Payer: Self-pay | Source: Home / Self Care | Attending: Surgery

## 2022-02-10 ENCOUNTER — Ambulatory Visit (HOSPITAL_COMMUNITY): Payer: Medicaid Other | Admitting: Certified Registered"

## 2022-02-10 ENCOUNTER — Other Ambulatory Visit: Payer: Self-pay

## 2022-02-10 ENCOUNTER — Ambulatory Visit (HOSPITAL_COMMUNITY): Payer: Medicaid Other | Admitting: Surgery

## 2022-02-10 ENCOUNTER — Encounter (HOSPITAL_COMMUNITY): Payer: Self-pay | Admitting: Surgery

## 2022-02-10 ENCOUNTER — Ambulatory Visit
Admit: 2022-02-10 | Discharge: 2022-02-10 | Disposition: A | Discharge status: Home / Self Care | Payer: Medicaid Other | Attending: Surgery | Admitting: Surgery

## 2022-02-10 DIAGNOSIS — K319 Disease of stomach and duodenum, unspecified: Secondary | ICD-10-CM | POA: Insufficient documentation

## 2022-02-10 DIAGNOSIS — K5730 Diverticulosis of large intestine without perforation or abscess without bleeding: Secondary | ICD-10-CM | POA: Insufficient documentation

## 2022-02-10 DIAGNOSIS — R110 Nausea: Secondary | ICD-10-CM | POA: Insufficient documentation

## 2022-02-10 DIAGNOSIS — R1013 Epigastric pain: Secondary | ICD-10-CM | POA: Insufficient documentation

## 2022-02-10 DIAGNOSIS — K449 Diaphragmatic hernia without obstruction or gangrene: Secondary | ICD-10-CM | POA: Insufficient documentation

## 2022-02-10 DIAGNOSIS — K2970 Gastritis, unspecified, without bleeding: Secondary | ICD-10-CM | POA: Insufficient documentation

## 2022-02-10 DIAGNOSIS — K5900 Constipation, unspecified: Secondary | ICD-10-CM | POA: Insufficient documentation

## 2022-02-10 DIAGNOSIS — K648 Other hemorrhoids: Secondary | ICD-10-CM | POA: Insufficient documentation

## 2022-02-10 DIAGNOSIS — K219 Gastro-esophageal reflux disease without esophagitis: Secondary | ICD-10-CM | POA: Insufficient documentation

## 2022-02-10 HISTORY — DX: Low back pain, unspecified: M54.50

## 2022-02-10 HISTORY — DX: Gastro-esophageal reflux disease without esophagitis: K21.9

## 2022-02-10 HISTORY — DX: Sleep apnea, unspecified: G47.30

## 2022-02-10 HISTORY — DX: Constipation, unspecified: K59.00

## 2022-02-10 SURGERY — GASTROSCOPY WITH BIOPSY
Anesthesia: General | Wound class: Clean Contaminated Wounds-The respiratory, GI, Genital, or urinary

## 2022-02-10 MED ORDER — LIDOCAINE (PF) 100 MG/5 ML (2 %) INTRAVENOUS SYRINGE
INJECTION | Freq: Once | INTRAVENOUS | Status: DC | PRN
Start: 2022-02-10 — End: 2022-02-10
  Administered 2022-02-10: 100 mg via INTRAVENOUS

## 2022-02-10 MED ORDER — LACTATED RINGERS INTRAVENOUS SOLUTION
INTRAVENOUS | Status: DC
Start: 2022-02-10 — End: 2022-02-10

## 2022-02-10 MED ORDER — PROPOFOL 10 MG/ML INTRAVENOUS EMULSION
Freq: Once | INTRAVENOUS | Status: DC | PRN
Start: 2022-02-10 — End: 2022-02-10
  Administered 2022-02-10: 42 mL via INTRAVENOUS

## 2022-02-10 SURGICAL SUPPLY — 5 items
FORCEPS BIOPSY MICROMESH TTH STREAMLINE CATH NEEDLE 240CM 2.4MM RJ 4 SS LRG CPC STRL DISP ORNG 2.8MM (ENDOSCOPIC SUPPLIES) ×1 IMPLANT
FORCEPS BIOPSY NEEDLE 240CM 2._4MM RJ 4 2.8MM LRG CPC STRL (INSTRUMENTS ENDOMECHANICAL) ×1
USE ITEM 60432 FORCEPS BIOPSY MICROMESH TTH STREAMLINE CATH NEEDLE 240CM 2.4MM RJ 4 SS LRG CPC STRL DISP ORNG 2.8MM (ENDOSCOPIC SUPPLIES) ×1 IMPLANT
VALVE AIR/H20 DEFENDO BUTTON KIT SUCT BIOPSY STRL DISP (ENDOSCOPIC SUPPLIES) ×1 IMPLANT
VALVE AIR/H20 DEFENDO BUTTON KIT SUCT BIOPSY STRL DISP (INSTRUMENTS ENDOMECHANICAL) ×1

## 2022-02-10 NOTE — Interval H&P Note (Signed)
[facility]      H&P UPDATE FORM                                                                                  Halle, Davlin, 46 y.o. female  Date of Admission:  02/10/2022  Date of Birth:  20-May-1976    02/10/2022    STOP: IF H&P IS GREATER THAN 30 DAYS FROM SURGICAL DAY COMPLETE NEW H&P IS REQUIRED.     H & P updated the day of the procedure.  1.  H&P completed within 30 days of surgical procedure and has been reviewed within 24 hours of admission but prior to surgery or a procedure requiring anesthesia services, the patient has been examined, and no change has occured in the patients condition since the H&P was completed.       Change in medications: No        No LMP recorded.      Comments:     2.  Patient continues to be appropriate candidate for planned surgical procedure. YES    Clide Dales, MD

## 2022-02-10 NOTE — OR Surgeon (Signed)
[facility]    OPERATIVE NOTE    Patient Name: Miranda Young, Citron [facility] MRN:: [id number]  Date of Birth: [birth year]  Date of Service: 02/10/2022     Pre-Operative Diagnosis: NAUSEA  ABD PAIN  CONSTIPATION     Post-Operative Diagnosis: Gastritis  Small Hiatal Hernia  Diverticulosis  Hemorrhoids    Procedure(s)/Description:  EGD  WITH BIOPSY: 43239 (CPT)  COLONOSCOPY: 82800 (CPT)     Attending Surgeon: Clide Dales, MD     Anesthesia Staff:  CRNA: Welton Flakes, CRNA    Anesthesia Type: .General     Estimated Blood Loss:  minimal    Specimens Removed:   ID Type Source Tests Collected by Time Destination   1 : gastric antrum bx x2 Tissue Gastric SURGICAL PATHOLOGY SPECIMEN Clide Dales, MD 02/10/2022 1217       Order Name Source Comment Collection Info Order Time   SURGICAL PATHOLOGY SPECIMEN Gastric Pre-op diagnosis:  NAUSEA  ABD PAIN  CONSTIPATION Collected By: Clide Dales, MD 02/10/2022 12:17 PM     Release to patient   Automated             Complications:  None    Condition:  Stable    Disposition:   PACU - hemodynamically stable.        Intraoperative Findings:     The Olympus gastroscope was brought to the operating field gently inserted into the patient's oropharynx and advanced to the level of the second and third portions of the duodenum under direct visualization without difficulty.  Once this anatomic landmark was reached the scope was slowly retracted with circumferential visualization of all upper intestinal walls with findings of gastritis and a small hiatal hernia.  There was no evidence of gastric or duodenal ulcers, polyps, or AV malformations.  Retroflexion of the scope revealed a small hiatal hernia.  The distal esophagus revealed no significant esophagitis with an intact Z line and no evidence of Barrett's changes.  The remainder of the esophagus was within normal limits without evidence of esophageal stricture or mass.  No esophageal varices.         Once the upper  endoscopy was performed the Olympus colonoscope was brought to the operating field gently inserted into the patient's rectum and advanced to the level of the cecum under direct visualization without difficulty.  Once this anatomic landmark was reached the scope was slowly retracted with circumferential visualization of all colonic and rectal walls with findings of internal hemorrhoids and sigmoid diverticulosis.  There was no evidence of colonic or rectal polyps, tumors or AV malformations.   The colonic and rectal mucosa revealed no significant abnormalities where visualized.  Retroflexion of the scope revealed the presence of internal hemorrhoids. The prep was overall adequate for diagnosstic exam however very small abnormalities may be missed given the nature of the exam.          Description of Procedure           The patient was brought to the Operating Suite and placed in the supine position on the operating table.  Anesthesia/nursing personnel provided IV access as well as hemodynamic monitoring.  After appropriate lines and leads were placed, the patient was placed in the left lateral decubitus position where they received total IV anesthesia.   After the patient was deemed comfortable, the Olympus gastroscope was brought into the operative field, inserted into the patient's oropharynx, and advanced to the level of the esophagus where the esophagus  was intubated without difficulty.  The scope was then passed down the esophagus into the stomach.  The stomach was insufflated with air followed by identification of the antrum of the stomach.  The pylorus of the stomach was identified followed by passage of the scope through the pylorus to the second and third portions of the duodenum without difficulty.  Once this anatomic location had been reached, the scope was slowly retracted with circumferential visualization of all upper intestinal walls with findings as dictated above.  The scope was brought back to the  antrum of the stomach where antral biopsy for Helicobacter pylori was accomplished followed by retroflexion of the scope to evaluate the fundus, body, and cardia of the stomach with findings as noted.  The scope was straightened and the stomach decompressed of as much air as possible.  The scope removed back to the GE junction which was inspected thoroughly.  The scope was then slowly retracted through the remainder of the esophagus.             After completion of the upper endoscopy, attention was directed to the perineum where the Olympus colonoscope was brought to the operative field, gently inserted into the patient's rectum, and advanced to the level of the cecum under direct visualization with identification of the cecum through the use of normal anatomic landmarks.   Once the cecum was clearly identified, the scope was slowly retracted with circumferential visualization of all colonic and rectal walls with findings dictated above. In the rectum  the scope was retroflexed to evaluate the anorectal junction for the presence of hemorrhoids.  The scope was slowly straightened, the colon decompressed of as much air as possible and removed without difficulty.  The patient was returned to the post anesthesia care unit in stable condition, having tolerated the procedure well.           Disposition:     Repeat colonoscopy in 10 years barring any change in symptoms    Reinaldo Meeker MD FACS RVT  [facility]

## 2022-02-10 NOTE — Anesthesia Postprocedure Evaluation (Signed)
Anesthesia Post Op Evaluation    Patient: Miranda Young  Procedure(s):  EGD  WITH BIOPSY  COLONOSCOPY    Last Vitals:Temperature: 37.2 C (99 F) (02/10/22 1233)  Heart Rate: 97 (02/10/22 1233)  BP (Non-Invasive): (!) 146/96 (02/10/22 1233)  Respiratory Rate: 12 (02/10/22 1233)  SpO2: 100 % (02/10/22 1233)    No notable events documented.    Patient is sufficiently recovered from the effects of anesthesia to participate in the evaluation and has returned to their pre-procedure level.  Patient location during evaluation: PACU       Patient participation: complete - patient participated  Level of consciousness: awake and alert and responsive to verbal stimuli    Pain score: 0  Pain management: adequate  Airway patency: patent    Anesthetic complications: no  Cardiovascular status: acceptable  Respiratory status: acceptable  Hydration status: acceptable  Patient post-procedure temperature: Pt Normothermic   PONV Status: Absent

## 2022-02-10 NOTE — Anesthesia Preprocedure Evaluation (Signed)
ANESTHESIA PRE-OP EVALUATION  Planned Procedure: EGD  WITH BIOPSY  COLONOSCOPY  Review of Systems     anesthesia history negative     patient summary reviewed  nursing notes reviewed        Pulmonary   sleep apnea,   Cardiovascular    Hypertension, ECG reviewed and hyperlipidemia , Exercise Tolerance: > or = 4 METS   ,beta blocker therapy  ,taken in last 24 hours     GI/Hepatic/Renal    GERD        Endo/Other    obesity,   type 2 diabetes    Neuro/Psych/MS    back abnormality, anxiety, bipolar disorder     Cancer    negative hematology/oncology ROS,                   Physical Assessment      Airway       Mallampati: I    TM distance: >3 FB    Mouth Opening: good.  No Facial hair          Dental       Dentition intact             Pulmonary    Breath sounds clear to auscultation  (-) no rhonchi, no decreased breath sounds, no wheezes, no rales and no stridor     Cardiovascular             Other findings            Plan  ASA 3     Planned anesthesia type: general     total intravenous anesthesia                        Anesthetic plan and risks discussed with patient  Signed consent obtained            Patient's NPO status is appropriate for Anesthesia.

## 2022-02-10 NOTE — Discharge Instructions (Addendum)
SURGICAL DISCHARGE INSTRUCTIONS     Dr. Georgiann Cocker, Gaston Islam, MD  performed your EGD  WITH BIOPSY, COLONOSCOPY today at the [facility]:  Monday through Friday from 8 a.m. - 4 p.m.: [phone]    For T&D: [phone]  Between 4 p.m. - 8 a.m., weekends and holidays:  Call ER [phone]    PLEASE SEE WRITTEN HANDOUTS AS DISCUSSED BY YOUR NURSE:  EGD, COLONOSCOPY    ANESTHESIA INFORMATION   ANESTHESIA -- ADULT PATIENTS:  You have received intravenous sedation / general anesthesia, and you may feel drowsy and light-headed for several hours. You may even experience some forgetfulness of the procedure. DO NOT DRIVE A MOTOR VEHICLE or perform any activity requiring complete alertness or coordination until you feel fully awake in about 24-48 hours. Do not drink alcoholic beverages for at least 24 hours. Do not stay alone, you must have a responsible adult available to be with you. You may also experience a dry mouth or nausea for 24 hours. This is a normal side effect and will disappear as the effects of the medication wear off.    REMEMBER   If you experience any difficulty breathing, chest pain, bleeding that you feel is excessive, persistent nausea or vomiting or for any other concerns:  Call your physician Dr.  Georgiann Cocker, Gaston Islam, MD   at [phone] . You may also ask to have the general doctor on call paged. They are available to you 24 hours a day.      SPECIAL INSTRUCTIONS / COMMENTS   REST TODAY. NO DRIVING, WORKING, OPERATING MACHINERY, COOKING TODAY. RESUME NORMAL ACTIVITY TOMORROW.  FINDINGS TODAY:  Gastritis  Small Hiatal Hernia  Diverticulosis  Hemorrhoids    FOLLOW-UP APPOINTMENTS   Please call your surgeon's office at the number listed to schedule a date / time of return for follow-up.   REPEAT COLONOSCOPY IN 10 YEARS UNLESS NEW PROBLEMS DEVELOP.

## 2022-02-11 DIAGNOSIS — Z6832 Body mass index (BMI) 32.0-32.9, adult: Secondary | ICD-10-CM

## 2022-02-11 DIAGNOSIS — K3189 Other diseases of stomach and duodenum: Secondary | ICD-10-CM

## 2022-02-11 LAB — SURGICAL PATHOLOGY SPECIMEN

## 2022-02-19 ENCOUNTER — Other Ambulatory Visit (HOSPITAL_COMMUNITY): Payer: Self-pay | Admitting: NURSE PRACTITIONER

## 2022-02-19 DIAGNOSIS — Z13820 Encounter for screening for osteoporosis: Secondary | ICD-10-CM

## 2022-03-06 ENCOUNTER — Encounter (HOSPITAL_COMMUNITY): Payer: Self-pay

## 2022-03-06 ENCOUNTER — Other Ambulatory Visit: Payer: Self-pay

## 2022-03-06 ENCOUNTER — Ambulatory Visit
Admission: RE | Admit: 2022-03-06 | Discharge: 2022-03-06 | Disposition: A | Discharge status: Home / Self Care | Payer: Medicaid Other | Source: Ambulatory Visit | Attending: NURSE PRACTITIONER | Admitting: NURSE PRACTITIONER

## 2022-03-06 DIAGNOSIS — Z13820 Encounter for screening for osteoporosis: Secondary | ICD-10-CM | POA: Insufficient documentation

## 2022-03-06 DIAGNOSIS — Z1231 Encounter for screening mammogram for malignant neoplasm of breast: Secondary | ICD-10-CM | POA: Insufficient documentation

## 2022-03-27 ENCOUNTER — Encounter (HOSPITAL_COMMUNITY): Payer: Self-pay | Admitting: Family

## 2022-03-27 ENCOUNTER — Emergency Department (HOSPITAL_COMMUNITY): Payer: Medicaid Other

## 2022-03-27 ENCOUNTER — Other Ambulatory Visit: Payer: Self-pay

## 2022-03-27 ENCOUNTER — Inpatient Hospital Stay (HOSPITAL_COMMUNITY): Payer: Medicaid Other | Admitting: Internal Medicine

## 2022-03-27 ENCOUNTER — Inpatient Hospital Stay
Admission: EM | Admit: 2022-03-27 | Discharge: 2022-03-29 | DRG: 641 | Disposition: A | Discharge status: Home / Self Care | Payer: Medicaid Other | Attending: Internal Medicine | Admitting: Internal Medicine

## 2022-03-27 DIAGNOSIS — F410 Panic disorder [episodic paroxysmal anxiety] without agoraphobia: Secondary | ICD-10-CM

## 2022-03-27 DIAGNOSIS — R519 Headache, unspecified: Secondary | ICD-10-CM

## 2022-03-27 DIAGNOSIS — R4182 Altered mental status, unspecified: Secondary | ICD-10-CM

## 2022-03-27 DIAGNOSIS — I160 Hypertensive urgency: Secondary | ICD-10-CM | POA: Diagnosis present

## 2022-03-27 DIAGNOSIS — R112 Nausea with vomiting, unspecified: Secondary | ICD-10-CM

## 2022-03-27 DIAGNOSIS — Z7984 Long term (current) use of oral hypoglycemic drugs: Secondary | ICD-10-CM

## 2022-03-27 DIAGNOSIS — E871 Hypo-osmolality and hyponatremia: Principal | ICD-10-CM | POA: Diagnosis present

## 2022-03-27 DIAGNOSIS — R202 Paresthesia of skin: Secondary | ICD-10-CM

## 2022-03-27 DIAGNOSIS — H538 Other visual disturbances: Secondary | ICD-10-CM

## 2022-03-27 DIAGNOSIS — G4730 Sleep apnea, unspecified: Secondary | ICD-10-CM | POA: Diagnosis present

## 2022-03-27 DIAGNOSIS — M5126 Other intervertebral disc displacement, lumbar region: Secondary | ICD-10-CM | POA: Diagnosis present

## 2022-03-27 DIAGNOSIS — E782 Mixed hyperlipidemia: Secondary | ICD-10-CM

## 2022-03-27 DIAGNOSIS — R748 Abnormal levels of other serum enzymes: Secondary | ICD-10-CM | POA: Diagnosis present

## 2022-03-27 DIAGNOSIS — E119 Type 2 diabetes mellitus without complications: Secondary | ICD-10-CM | POA: Diagnosis present

## 2022-03-27 DIAGNOSIS — Z9989 Dependence on other enabling machines and devices: Secondary | ICD-10-CM

## 2022-03-27 DIAGNOSIS — R200 Anesthesia of skin: Secondary | ICD-10-CM

## 2022-03-27 DIAGNOSIS — Z87891 Personal history of nicotine dependence: Secondary | ICD-10-CM

## 2022-03-27 DIAGNOSIS — E7800 Pure hypercholesterolemia, unspecified: Secondary | ICD-10-CM | POA: Diagnosis present

## 2022-03-27 DIAGNOSIS — K219 Gastro-esophageal reflux disease without esophagitis: Secondary | ICD-10-CM

## 2022-03-27 DIAGNOSIS — F419 Anxiety disorder, unspecified: Secondary | ICD-10-CM | POA: Diagnosis present

## 2022-03-27 DIAGNOSIS — E781 Pure hyperglyceridemia: Secondary | ICD-10-CM | POA: Diagnosis present

## 2022-03-27 DIAGNOSIS — I10 Essential (primary) hypertension: Secondary | ICD-10-CM | POA: Diagnosis present

## 2022-03-27 DIAGNOSIS — F319 Bipolar disorder, unspecified: Secondary | ICD-10-CM | POA: Diagnosis present

## 2022-03-27 HISTORY — DX: Depression, unspecified: F32.A

## 2022-03-27 HISTORY — DX: Pure hypercholesterolemia, unspecified: E78.00

## 2022-03-27 LAB — URINALYSIS, MACROSCOPIC
BILIRUBIN: NEGATIVE mg/dL
BLOOD: NEGATIVE mg/dL
GLUCOSE: NEGATIVE mg/dL
KETONES: NEGATIVE mg/dL
LEUKOCYTES: NEGATIVE WBCs/uL
NITRITE: NEGATIVE
PH: 7 (ref 5.0–9.0)
PROTEIN: NEGATIVE mg/dL
SPECIFIC GRAVITY: 1.028 (ref 1.002–1.030)
UROBILINOGEN: NORMAL mg/dL

## 2022-03-27 LAB — THYROID STIMULATING HORMONE WITH FREE T4 REFLEX: TSH: 4.157 u[IU]/mL (ref 0.450–5.330)

## 2022-03-27 LAB — BASIC METABOLIC PANEL
ANION GAP: 12 mmol/L (ref 10–20)
ANION GAP: 7 mmol/L — ABNORMAL LOW (ref 10–20)
ANION GAP: 8 mmol/L — ABNORMAL LOW (ref 10–20)
ANION GAP: 8 mmol/L — ABNORMAL LOW (ref 10–20)
BUN/CREA RATIO: 10 (ref 6–22)
BUN/CREA RATIO: 11 (ref 6–22)
BUN/CREA RATIO: 10 (ref 6–22)
BUN/CREA RATIO: 11 (ref 6–22)
BUN: 11 mg/dL (ref 7–25)
BUN: 11 mg/dL (ref 7–25)
BUN: 10 mg/dL (ref 7–25)
BUN: 10 mg/dL (ref 7–25)
CALCIUM: 8.6 mg/dL (ref 8.6–10.3)
CALCIUM: 8 mg/dL — ABNORMAL LOW (ref 8.6–10.3)
CALCIUM: 8 mg/dL — ABNORMAL LOW (ref 8.6–10.3)
CALCIUM: 8.2 mg/dL — ABNORMAL LOW (ref 8.6–10.3)
CHLORIDE: 85 mmol/L — ABNORMAL LOW (ref 98–107)
CHLORIDE: 89 mmol/L — ABNORMAL LOW (ref 98–107)
CHLORIDE: 90 mmol/L — ABNORMAL LOW (ref 98–107)
CHLORIDE: 89 mmol/L — ABNORMAL LOW (ref 98–107)
CO2 TOTAL: 19 mmol/L — ABNORMAL LOW (ref 21–31)
CO2 TOTAL: 21 mmol/L (ref 21–31)
CO2 TOTAL: 21 mmol/L (ref 21–31)
CO2 TOTAL: 22 mmol/L (ref 21–31)
CREATININE: 1.09 mg/dL (ref 0.60–1.30)
CREATININE: 0.98 mg/dL (ref 0.60–1.30)
CREATININE: 0.97 mg/dL (ref 0.60–1.30)
CREATININE: 0.92 mg/dL (ref 0.60–1.30)
ESTIMATED GFR: 63 mL/min/{1.73_m2} (ref >59)
ESTIMATED GFR: 72 mL/min/{1.73_m2} (ref >59)
ESTIMATED GFR: 73 mL/min/{1.73_m2} (ref >59)
ESTIMATED GFR: 77 mL/min/{1.73_m2} (ref >59)
GLUCOSE: 126 mg/dL — ABNORMAL HIGH (ref 74–109)
GLUCOSE: 108 mg/dL (ref 74–109)
GLUCOSE: 108 mg/dL (ref 74–109)
GLUCOSE: 106 mg/dL (ref 74–109)
OSMOLALITY, CALCULATED: 236 mOsm/kg — ABNORMAL LOW (ref 270–290)
OSMOLALITY, CALCULATED: 237 mOsm/kg — ABNORMAL LOW (ref 270–290)
OSMOLALITY, CALCULATED: 240 mOsm/kg — ABNORMAL LOW (ref 270–290)
OSMOLALITY, CALCULATED: 240 mOsm/kg — ABNORMAL LOW (ref 270–290)
POTASSIUM: 4.3 mmol/L (ref 3.5–5.1)
POTASSIUM: 4.4 mmol/L (ref 3.5–5.1)
POTASSIUM: 4 mmol/L (ref 3.5–5.1)
POTASSIUM: 3.8 mmol/L (ref 3.5–5.1)
SODIUM: 116 mmol/L — CL (ref 136–145)
SODIUM: 117 mmol/L — CL (ref 136–145)
SODIUM: 119 mmol/L — CL (ref 136–145)
SODIUM: 119 mmol/L — CL (ref 136–145)

## 2022-03-27 LAB — COMPREHENSIVE METABOLIC PANEL, NON-FASTING
ALBUMIN/GLOBULIN RATIO: 1.8 — ABNORMAL HIGH (ref 0.8–1.4)
ALBUMIN: 4.3 g/dL (ref 3.5–5.7)
ALKALINE PHOSPHATASE: 90 U/L (ref 34–104)
ALT (SGPT): 18 U/L (ref 7–52)
ANION GAP: 8 mmol/L — ABNORMAL LOW (ref 10–20)
AST (SGOT): 19 U/L (ref 13–39)
BILIRUBIN TOTAL: 0.3 mg/dL (ref 0.3–1.2)
BUN/CREA RATIO: 10 (ref 6–22)
BUN: 11 mg/dL (ref 7–25)
CALCIUM, CORRECTED: 8.3 mg/dL — ABNORMAL LOW (ref 8.9–10.8)
CALCIUM: 8.6 mg/dL (ref 8.6–10.3)
CHLORIDE: 87 mmol/L — ABNORMAL LOW (ref 98–107)
CO2 TOTAL: 23 mmol/L (ref 21–31)
CREATININE: 1.05 mg/dL (ref 0.60–1.30)
ESTIMATED GFR: 66 mL/min/{1.73_m2} (ref >59)
GLOBULIN: 2.4 — ABNORMAL LOW (ref 2.9–5.4)
GLUCOSE: 130 mg/dL — ABNORMAL HIGH (ref 74–109)
OSMOLALITY, CALCULATED: 240 mOsm/kg — ABNORMAL LOW (ref 270–290)
POTASSIUM: 4.3 mmol/L (ref 3.5–5.1)
PROTEIN TOTAL: 6.7 g/dL (ref 6.4–8.9)
SODIUM: 118 mmol/L — CL (ref 136–145)

## 2022-03-27 LAB — CBC WITH DIFF
BASOPHIL #: 0 10*3/uL (ref 0.00–0.30)
BASOPHIL %: 1 % (ref 0–3)
EOSINOPHIL #: 0.1 10*3/uL (ref 0.00–0.80)
EOSINOPHIL %: 1 % (ref 0–7)
HCT: 35.1 % — ABNORMAL LOW (ref 37.0–47.0)
HGB: 12.1 g/dL — ABNORMAL LOW (ref 12.5–16.0)
LYMPHOCYTE #: 2.1 10*3/uL (ref 1.10–5.00)
LYMPHOCYTE %: 40 % (ref 25–45)
MCH: 27 pg (ref 27.0–32.0)
MCHC: 34.5 g/dL (ref 32.0–36.0)
MCV: 78.2 fL (ref 78.0–99.0)
MONOCYTE #: 0.3 10*3/uL (ref 0.00–1.30)
MONOCYTE %: 6 % (ref 0–12)
MPV: 6.4 fL — ABNORMAL LOW (ref 7.4–10.4)
NEUTROPHIL #: 2.7 10*3/uL (ref 1.80–8.40)
NEUTROPHIL %: 52 % (ref 40–76)
PLATELETS: 325 10*3/uL (ref 140–440)
RBC: 4.49 10*6/uL (ref 4.20–5.40)
RDW: 12.4 % (ref 11.6–14.8)
WBC: 5.2 10*3/uL (ref 4.0–10.5)
WBCS UNCORRECTED: 5.2 10*3/uL

## 2022-03-27 LAB — URINE DRUG SCREEN
AMPHET QL: NEGATIVE
BARB QL: NEGATIVE
BENZO QL: NEGATIVE
BUP QL: NEGATIVE
CANNAQL: NEGATIVE
COCQL: NEGATIVE
METHQL: NEGATIVE
OPIATE: NEGATIVE
OXYCODONE URINE: NEGATIVE
PCP QL: NEGATIVE
TRICYCLIC ANTIDEPRESSANTS URINE: POSITIVE — AB

## 2022-03-27 LAB — URINALYSIS, MICROSCOPIC
HYALINE CASTS: 1 /lpf — ABNORMAL HIGH (ref <0)
RBCS: 17 /hpf — ABNORMAL HIGH (ref <4)
SQUAMOUS EPITHELIAL: 3 /hpf (ref <28)
WBCS: 1 /hpf (ref <6)

## 2022-03-27 LAB — PT/INR
INR: 1.03 (ref <=5.00)
PROTHROMBIN TIME: 12 seconds (ref 9.8–12.7)

## 2022-03-27 LAB — SODIUM, RANDOM URINE: SODIUM RANDOM URINE: 50 mmol/L (ref 40–220)

## 2022-03-27 LAB — POC BLOOD GLUCOSE (RESULTS): GLUCOSE, POC: 109 mg/dl (ref 50–500)

## 2022-03-27 LAB — SEDIMENTATION RATE: ERYTHROCYTE SEDIMENTATION RATE (ESR): 1 mm/hr (ref <20)

## 2022-03-27 LAB — AMMONIA: AMMONIA: 26 umol/L (ref 16–53)

## 2022-03-27 LAB — TROPONIN-I: TROPONIN I: 3 ng/L (ref <15)

## 2022-03-27 LAB — ETHANOL, SERUM: ETHANOL: <10 mg/dL — ABNORMAL HIGH

## 2022-03-27 LAB — CREATINE KINASE (CK), TOTAL, SERUM: CREATINE KINASE: 399 U/L — ABNORMAL HIGH (ref 30–223)

## 2022-03-27 LAB — C-REACTIVE PROTEIN (CRP): C-REACTIVE PROTEIN (CRP): 0.4 mg/dL (ref 0.1–0.5)

## 2022-03-27 LAB — LACTIC ACID LEVEL W/ REFLEX FOR LEVEL >2.0: LACTIC ACID: 1.8 mmol/L (ref 0.5–2.2)

## 2022-03-27 LAB — PTT (PARTIAL THROMBOPLASTIN TIME): APTT: 32 seconds (ref 26.0–36.0)

## 2022-03-27 LAB — MAGNESIUM: MAGNESIUM: 1.5 mg/dL — ABNORMAL LOW (ref 1.9–2.7)

## 2022-03-27 MED ORDER — LISINOPRIL 5 MG TABLET
ORAL_TABLET | ORAL | Status: AC
Start: 2022-03-27 — End: 2022-03-27
  Filled 2022-03-27: qty 2

## 2022-03-27 MED ORDER — GLUCAGON 1 MG/ML SOLUTION FOR INJECTION
1.0000 mg | INTRAMUSCULAR | Status: DC | PRN
Start: 2022-03-27 — End: 2022-03-29

## 2022-03-27 MED ORDER — ONDANSETRON HCL (PF) 4 MG/2 ML INJECTION SOLUTION
4.0000 mg | Freq: Four times a day (QID) | INTRAMUSCULAR | Status: DC | PRN
Start: 2022-03-27 — End: 2022-03-29
  Administered 2022-03-28: 4 mg via INTRAVENOUS
  Filled 2022-03-27: qty 2

## 2022-03-27 MED ORDER — DICLOFENAC SODIUM 50 MG TABLET,DELAYED RELEASE
50.0000 mg | DELAYED_RELEASE_TABLET | Freq: Three times a day (TID) | ORAL | Status: DC
Start: 2022-03-27 — End: 2022-03-29
  Administered 2022-03-27 – 2022-03-29 (×5): 50 mg via ORAL
  Filled 2022-03-27 (×8): qty 1

## 2022-03-27 MED ORDER — QUETIAPINE 100 MG TABLET
400.0000 mg | ORAL_TABLET | Freq: Every evening | ORAL | Status: DC
Start: 2022-03-27 — End: 2022-03-29
  Administered 2022-03-27 – 2022-03-28 (×2): 400 mg via ORAL
  Filled 2022-03-27: qty 4

## 2022-03-27 MED ORDER — BUSPIRONE 5 MG TABLET
15.0000 mg | ORAL_TABLET | Freq: Three times a day (TID) | ORAL | Status: DC
Start: 2022-03-27 — End: 2022-03-29
  Administered 2022-03-27 – 2022-03-29 (×5): 15 mg via ORAL
  Filled 2022-03-27 (×4): qty 3

## 2022-03-27 MED ORDER — SODIUM CHLORIDE 0.9 % (FLUSH) INJECTION SYRINGE
3.0000 mL | INJECTION | INTRAMUSCULAR | Status: DC | PRN
Start: 2022-03-27 — End: 2022-03-29

## 2022-03-27 MED ORDER — HYDROXYZINE PAMOATE 25 MG CAPSULE
50.0000 mg | ORAL_CAPSULE | Freq: Four times a day (QID) | ORAL | Status: DC | PRN
Start: 2022-03-27 — End: 2022-03-29

## 2022-03-27 MED ORDER — METOPROLOL SUCCINATE ER 25 MG TABLET,EXTENDED RELEASE 24 HR
25.0000 mg | ORAL_TABLET | Freq: Every day | ORAL | Status: DC
Start: 2022-03-27 — End: 2022-03-29
  Administered 2022-03-27: 25 mg via ORAL
  Administered 2022-03-28: 0 mg via ORAL
  Administered 2022-03-29: 25 mg via ORAL
  Filled 2022-03-27 (×2): qty 1

## 2022-03-27 MED ORDER — MAGNESIUM SULFATE 1 GRAM/100 ML IN DEXTROSE 5 % INTRAVENOUS PIGGYBACK
1.0000 g | INJECTION | INTRAVENOUS | Status: AC
Start: 2022-03-27 — End: 2022-03-27
  Administered 2022-03-27: 0 g via INTRAVENOUS
  Administered 2022-03-27: 2 g via INTRAVENOUS
  Administered 2022-03-27: 0 g via INTRAVENOUS

## 2022-03-27 MED ORDER — LORAZEPAM 1 MG TABLET
1.0000 mg | ORAL_TABLET | Freq: Two times a day (BID) | ORAL | Status: DC | PRN
Start: 2022-03-27 — End: 2022-03-29
  Administered 2022-03-28 (×3): 1 mg via ORAL
  Filled 2022-03-27 (×3): qty 1

## 2022-03-27 MED ORDER — DEXTROSE 50 % IN WATER (D50W) INTRAVENOUS SYRINGE
25.0000 g | INJECTION | INTRAVENOUS | Status: DC | PRN
Start: 2022-03-27 — End: 2022-03-29

## 2022-03-27 MED ORDER — MAGNESIUM SULFATE 1 GRAM/100 ML IN DEXTROSE 5 % INTRAVENOUS PIGGYBACK
INJECTION | INTRAVENOUS | Status: AC
Start: 2022-03-27 — End: 2022-03-27
  Filled 2022-03-27: qty 200

## 2022-03-27 MED ORDER — INSULIN REGULAR HUMAN 100 UNIT/ML INJECTION SSIP
0.0000 [IU] | INJECTION | Freq: Four times a day (QID) | SUBCUTANEOUS | Status: DC | PRN
Start: 2022-03-27 — End: 2022-03-29

## 2022-03-27 MED ORDER — LORAZEPAM 2 MG/ML INJECTION SYRINGE
INJECTION | INTRAMUSCULAR | Status: AC
Start: 2022-03-27 — End: 2022-03-27
  Filled 2022-03-27: qty 1

## 2022-03-27 MED ORDER — QUETIAPINE 100 MG TABLET
ORAL_TABLET | ORAL | Status: AC
Start: 2022-03-27 — End: 2022-03-27
  Filled 2022-03-27: qty 4

## 2022-03-27 MED ORDER — ATORVASTATIN 40 MG TABLET
40.0000 mg | ORAL_TABLET | Freq: Every evening | ORAL | Status: DC
Start: 2022-03-27 — End: 2022-03-29
  Administered 2022-03-27 – 2022-03-28 (×2): 40 mg via ORAL
  Filled 2022-03-27: qty 1

## 2022-03-27 MED ORDER — SODIUM CHLORIDE 3 % HYPERTONIC INTRAVENOUS INJECTION SOLUTION
25.0000 mL/h | INTRAVENOUS | Status: AC
Start: 2022-03-27 — End: 2022-03-28
  Administered 2022-03-27: 25 mL/h via INTRAVENOUS
  Filled 2022-03-27: qty 500

## 2022-03-27 MED ORDER — IOHEXOL 350 MG IODINE/ML INTRAVENOUS SOLUTION
100.0000 mL | INTRAVENOUS | Status: AC
Start: 2022-03-27 — End: 2022-03-27
  Administered 2022-03-27: 100 mL via INTRAVENOUS

## 2022-03-27 MED ORDER — SODIUM CHLORIDE 3 % HYPERTONIC INTRAVENOUS INJECTION SOLUTION
INTRAVENOUS | Status: AC
Start: 2022-03-27 — End: 2022-03-27
  Filled 2022-03-27: qty 500

## 2022-03-27 MED ORDER — CLONIDINE HCL 0.1 MG TABLET
0.1000 mg | ORAL_TABLET | ORAL | Status: AC
Start: 2022-03-27 — End: 2022-03-27
  Administered 2022-03-27: 0.1 mg via ORAL

## 2022-03-27 MED ORDER — SODIUM CHLORIDE 0.9 % IV BOLUS
1000.0000 mL | INJECTION | Status: AC
Start: 2022-03-27 — End: 2022-03-27
  Administered 2022-03-27: 1000 mL via INTRAVENOUS
  Administered 2022-03-27: 0 mL via INTRAVENOUS

## 2022-03-27 MED ORDER — QUETIAPINE 25 MG TABLET
50.0000 mg | ORAL_TABLET | Freq: Two times a day (BID) | ORAL | Status: DC
Start: 2022-03-28 — End: 2022-03-29
  Administered 2022-03-28 – 2022-03-29 (×3): 50 mg via ORAL
  Filled 2022-03-27 (×3): qty 2

## 2022-03-27 MED ORDER — BUSPIRONE 5 MG TABLET
ORAL_TABLET | ORAL | Status: AC
Start: 2022-03-27 — End: 2022-03-27
  Filled 2022-03-27: qty 3

## 2022-03-27 MED ORDER — METOPROLOL SUCCINATE ER 25 MG TABLET,EXTENDED RELEASE 24 HR
ORAL_TABLET | ORAL | Status: AC
Start: 2022-03-27 — End: 2022-03-27
  Filled 2022-03-27: qty 1

## 2022-03-27 MED ORDER — PANTOPRAZOLE 40 MG TABLET,DELAYED RELEASE
40.0000 mg | DELAYED_RELEASE_TABLET | Freq: Every day | ORAL | Status: DC
Start: 2022-03-28 — End: 2022-03-29
  Administered 2022-03-28 – 2022-03-29 (×2): 40 mg via ORAL
  Filled 2022-03-27 (×2): qty 1

## 2022-03-27 MED ORDER — LISINOPRIL 5 MG TABLET
30.0000 mg | ORAL_TABLET | Freq: Every day | ORAL | Status: DC
Start: 2022-03-27 — End: 2022-03-29
  Administered 2022-03-27: 30 mg via ORAL
  Administered 2022-03-28: 0 mg via ORAL
  Administered 2022-03-29: 30 mg via ORAL
  Filled 2022-03-27 (×2): qty 2

## 2022-03-27 MED ORDER — MIRTAZAPINE 45 MG TABLET
45.0000 mg | ORAL_TABLET | Freq: Every evening | ORAL | Status: DC
Start: 2022-03-27 — End: 2022-03-29
  Administered 2022-03-27 – 2022-03-28 (×2): 45 mg via ORAL
  Filled 2022-03-27 (×2): qty 1

## 2022-03-27 MED ORDER — ACETAMINOPHEN 325 MG TABLET
650.0000 mg | ORAL_TABLET | ORAL | Status: DC | PRN
Start: 2022-03-27 — End: 2022-03-29
  Administered 2022-03-28: 650 mg via ORAL
  Filled 2022-03-27: qty 2

## 2022-03-27 MED ORDER — ATORVASTATIN 40 MG TABLET
ORAL_TABLET | ORAL | Status: AC
Start: 2022-03-27 — End: 2022-03-27
  Filled 2022-03-27: qty 1

## 2022-03-27 MED ORDER — SODIUM CHLORIDE 0.9 % INTRAVENOUS PIGGYBACK
INJECTION | INTRAVENOUS | Status: AC
Start: 2022-03-27 — End: 2022-03-27
  Filled 2022-03-27: qty 50

## 2022-03-27 MED ORDER — LORAZEPAM 2 MG/ML INJECTION WRAPPER
2.0000 mg | INTRAMUSCULAR | Status: AC
Start: 2022-03-27 — End: 2022-03-27
  Administered 2022-03-27: 2 mg via INTRAVENOUS

## 2022-03-27 MED ORDER — DEXTROSE 50 % IN WATER (D50W) INTRAVENOUS SYRINGE
12.5000 g | INJECTION | INTRAVENOUS | Status: DC | PRN
Start: 2022-03-27 — End: 2022-03-29

## 2022-03-27 MED ORDER — SODIUM CHLORIDE 0.9 % (FLUSH) INJECTION SYRINGE
3.0000 mL | INJECTION | Freq: Three times a day (TID) | INTRAMUSCULAR | Status: DC
Start: 2022-03-27 — End: 2022-03-29
  Administered 2022-03-27 – 2022-03-28 (×2): 3 mL
  Administered 2022-03-28 – 2022-03-29 (×3): 0 mL

## 2022-03-27 MED ORDER — LISINOPRIL 20 MG TABLET
ORAL_TABLET | ORAL | Status: AC
Start: 2022-03-27 — End: 2022-03-27
  Filled 2022-03-27: qty 1

## 2022-03-27 MED ORDER — HYDRALAZINE 20 MG/ML INJECTION SOLUTION
10.0000 mg | Freq: Four times a day (QID) | INTRAMUSCULAR | Status: DC | PRN
Start: 2022-03-27 — End: 2022-03-29

## 2022-03-27 MED ORDER — CLONIDINE HCL 0.1 MG TABLET
ORAL_TABLET | ORAL | Status: AC
Start: 2022-03-27 — End: 2022-03-27
  Filled 2022-03-27: qty 1

## 2022-03-27 MED ORDER — CEFEPIME 1 GRAM SOLUTION FOR INJECTION
INTRAMUSCULAR | Status: AC
Start: 2022-03-27 — End: 2022-03-27
  Filled 2022-03-27: qty 10

## 2022-03-27 MED ORDER — SODIUM CHLORIDE 0.9 % INTRAVENOUS SOLUTION
INTRAVENOUS | Status: DC
Start: 2022-03-27 — End: 2022-03-27
  Administered 2022-03-27: 0 mL via INTRAVENOUS

## 2022-03-27 NOTE — ED Provider Notes (Signed)
[facility]  Emergency Department  Attending Provider Note      CHIEF COMPLAINT  Chief Complaint   Patient presents with    Numbness    Headache     HISTORY OF PRESENT ILLNESS  Miranda Young, date of birth 1976/06/07, is a 46 y.o. female who presented to the Emergency Department.    THE PATIENT PRESENTED TO THE EMERGENCY DEPARTMENT TODAY WITH A STATED COMPLAINT OF INCREASED BLOOD PRESSURE CAUSING A HEADACHE AND SOME FACIAL NUMBNESS.  PATIENT HAS A HISTORY OF ANXIETY.  NOTHING REPORTED BETTER EXACERBATE THE CONDITION OTHERWISE.  SYMPTOMS OVERALL MILD-TO-MODERATE IN NATURE.  NO FOCAL EXTREMITY WEAKNESS.  NO FEVERS CHILLS CHEST PAIN PALPITATIONS DYSURIA HEMATURIA NAUSEA VOMITING.  A COMPREHENSIVE 10+ REVIEW OF SYSTEMS OTHERWISE NEGATIVE.  THERE ARE NO OTHER ACUTE COMPLAINTS REPORTED TO ME BY THE PATIENT.    PAST MEDICAL/SURGICAL/FAMILY/SOCIAL HISTORY  Past Medical History:   Diagnosis Date    Anxiety     Bipolar disorder, unspecified (CMS HCC)     Constipation     Diabetes mellitus, type 2 (CMS HCC)     Esophageal reflux     High triglycerides     HTN (hypertension)     Lumbar back pain     2 herniated discs    Sleep apnea     has CPAP       Past Surgical History:   Procedure Laterality Date    HX APPENDECTOMY      HX HYSTERECTOMY      HX TUBAL LIGATION         Family Medical History:       Problem Relation (Age of Onset)    Breast Cancer Paternal Grandmother, Maternal Aunt (68)    No Known Problems Mother, Father, Sister, Brother, Maternal Grandmother, Maternal Grandfather, Paternal 59, Daughter, Son, Maternal Uncle, Paternal 63, Paternal Uncle, Other          Social History     Socioeconomic History    Marital status: Divorced   Tobacco Use    Smoking status: Former     Types: Cigarettes    Smokeless tobacco: Never   Substance and Sexual Activity    Alcohol use: Not Currently    Drug use: Not Currently      ALLERGIES  Allergies   Allergen Reactions    Amoxicillin Nausea/ Vomiting    Iron  Nausea/ Vomiting       PHYSICAL EXAM  VITAL SIGNS:  Filed Vitals:    03/27/22 1400 03/27/22 1415 03/27/22 1430 03/27/22 1445   BP: (!) 159/98 (!) 146/94     Pulse: 97 84 93 85   Resp: (!) 26 17 (!) 10    Temp:       SpO2: 100% 95% 93% 97%     GENERAL: PATIENT IS ALERT AND ORIENTED TO PERSON, PLACE, AND TIME.  HEAD: NORMOCEPHALIC AND ATRAUMATIC.  EYES: PUPILS EQUALLY ROUND AND REACT TO LIGHT. EXTRAOCULAR MOVEMENTS INTACT.  EARS: GROSS HEARING INTACT. EXTERNAL EARS WITHIN NORMAL LIMITS.  NOSE: NO SEPTAL DEVIATION. NASAL PASSAGES CLEAR.  THROAT: MOIST ORAL MUCOSA. NO ERYTHEMA OR EXUDATE OF THE PHARYNX.  NECK: SUPPLE. TRACHEA MIDLINE.  HEART: REGULAR, RATE, AND RHYTHM.  LUNGS: CLEAR TO AUSCULTATION BILATERAL BUT DECREASED.  ABDOMEN: SOFT, NON-TENDER, NON-DISTENDED, AND BOWEL SOUNDS ARE PRESENT.  OVERWEIGHT.  GENITOURINARY: DEFERRED.  RECTAL: DEFERRED.  EXTREMITIES: NO CYANOSIS, CLUBBING, OR EDEMA.  SKIN: WARM AND DRY.  MUSCULOSKELETAL: DEFERRED.  NEUROLOGIC: CRANIAL NERVES II THROUGH XII ARE GROSSLY INTACT. SENSATION  TO LIGHT TOUCH IS INTACT.  PSYCHIATRIC: JUDGMENT AND INSIGHT ARE SEEMINGLY INTACT. MOOD VERY ANXIOUS AND AT TIMES TEARFUL AND AFFECT IS APPROPRIATE FOR THE SITUATION.      DIAGNOSTICS  Labs:  Labs listed below were reviewed and interpreted by me.  Results for orders placed or performed during the hospital encounter of 03/27/22   TROPONIN-I   Result Value Ref Range    TROPONIN I 3 <15 ng/L   THYROID STIMULATING HORMONE WITH FREE T4 REFLEX   Result Value Ref Range    TSH 4.157 0.450 - 5.330 uIU/mL   LACTIC ACID LEVEL W/ REFLEX FOR LEVEL >2.0   Result Value Ref Range    LACTIC ACID 1.8 0.5 - 2.2 mmol/L   MAGNESIUM   Result Value Ref Range    MAGNESIUM 1.5 (L) 1.9 - 2.7 mg/dL   PT/INR   Result Value Ref Range    PROTHROMBIN TIME 12.0 9.8 - 12.7 seconds    INR 1.03 <=5.00   PTT (PARTIAL THROMBOPLASTIN TIME)   Result Value Ref Range    APTT 32.0 26.0 - 36.0 seconds   SEDIMENTATION RATE   Result Value Ref Range     ERYTHROCYTE SEDIMENTATION RATE (ESR) 1 <20 mm/hr   AMMONIA   Result Value Ref Range    AMMONIA 26 16 - 53 umol/L   COMPREHENSIVE METABOLIC PANEL, NON-FASTING   Result Value Ref Range    SODIUM 118 (LL) 136 - 145 mmol/L    POTASSIUM 4.3 3.5 - 5.1 mmol/L    CHLORIDE 87 (L) 98 - 107 mmol/L    CO2 TOTAL 23 21 - 31 mmol/L    ANION GAP 8 (L) 10 - 20 mmol/L    BUN 11 7 - 25 mg/dL    CREATININE 1.05 0.60 - 1.30 mg/dL    BUN/CREA RATIO 10 6 - 22    ESTIMATED GFR 66 >59 mL/min/1.9m2    ALBUMIN 4.3 3.5 - 5.7 g/dL    CALCIUM 8.6 8.6 - 10.3 mg/dL    GLUCOSE 130 (H) 74 - 109 mg/dL    ALKALINE PHOSPHATASE 90 34 - 104 U/L    ALT (SGPT) 18 7 - 52 U/L    AST (SGOT) 19 13 - 39 U/L    BILIRUBIN TOTAL 0.3 0.3 - 1.2 mg/dL    PROTEIN TOTAL 6.7 6.4 - 8.9 g/dL    ALBUMIN/GLOBULIN RATIO 1.8 (H) 0.8 - 1.4    OSMOLALITY, CALCULATED 240 (L) 270 - 290 mOsm/kg    CALCIUM, CORRECTED 8.3 (L) 8.9 - 10.8 mg/dL    GLOBULIN 2.4 (L) 2.9 - 5.4   C-REACTIVE PROTEIN (CRP)   Result Value Ref Range    C-REACTIVE PROTEIN (CRP) 0.4 0.1 - 0.5 mg/dL   CREATINE KINASE (CK), TOTAL, SERUM   Result Value Ref Range    CREATINE KINASE 399 (H) 30 - 223 U/L   ETHANOL, SERUM   Result Value Ref Range    ETHANOL <10 (H) 0 mg/dL   CBC WITH DIFF   Result Value Ref Range    WBCS UNCORRECTED 5.2 x10^3/uL    WBC 5.2 4.0 - 10.5 x10^3/uL    RBC 4.49 4.20 - 5.40 x10^6/uL    HGB 12.1 (L) 12.5 - 16.0 g/dL    HCT 35.1 (L) 37.0 - 47.0 %    MCV 78.2 78.0 - 99.0 fL    MCH 27.0 27.0 - 32.0 pg    MCHC 34.5 32.0 - 36.0 g/dL    RDW 12.4 11.6 - 14.8 %  PLATELETS 325 140 - 440 x10^3/uL    MPV 6.4 (L) 7.4 - 10.4 fL    NEUTROPHIL % 52 40 - 76 %    LYMPHOCYTE % 40 25 - 45 %    MONOCYTE % 6 0 - 12 %    EOSINOPHIL % 1 0 - 7 %    BASOPHIL % 1 0 - 3 %    NEUTROPHIL # 2.70 1.80 - 8.40 x10^3/uL    LYMPHOCYTE # 2.10 1.10 - 5.00 x10^3/uL    MONOCYTE # 0.30 0.00 - 1.30 x10^3/uL    EOSINOPHIL # 0.10 0.00 - 0.80 x10^3/uL    BASOPHIL # 0.00 0.00 - 0.30 x10^3/uL     Radiology:  Results for orders placed  or performed during the hospital encounter of 03/27/22   CT BRAIN WO IV CONTRAST     Status: None    Narrative    Jalila S Axelson    RADIOLOGIST: Colletta Maryland, MD    CT BRAIN WO IV CONTRAST performed on 03/27/2022 12:24 PM    CLINICAL HISTORY: headache.  HA, NUMBNESS, AMS, DIZZINESS    TECHNIQUE:  Head CT without intravenous contrast.    COMPARISON: None.  # of known CTs in the past 12 months:  0   # of known Cardiac Nuclear Medicine Studies in the past 12 months:  0    FINDINGS:  There is no acute intracranial hemorrhage, mass effect, or evidence of large acute infarct.    Brain: Normal    CSF Spaces: Normal     Sinuses/Mastoids:  Clear at visualized levels     Bones: Unremarkable        Impression    NO ACUTE FINDINGS      One or more dose reduction techniques were used (e.g., Automated exposure control, adjustment of the mA and/or kV according to patient size, use of iterative reconstruction technique).      Radiologist location ID: [id number]     XR AP MOBILE CHEST     Status: None    Narrative    Erline Levine Alejandro    RADIOLOGIST: Danne Baxter, MD    XR AP MOBILE CHEST performed on 03/27/2022 12:52 PM    CLINICAL HISTORY: FEVER.  fever, dizziness    TECHNIQUE: Frontal view of the chest.    COMPARISON:  10/11/2014    FINDINGS:    The heart size is normal.   The lungs are clear.           Impression    NEGATIVE CHEST      Radiologist location ID: [id number]     CT ANGIO INTRA-EXTRA CRANIAL W IV CONTRAST     Status: None    Narrative    Ashlie S Mirabile    RADIOLOGIST: Colletta Maryland, MD    CT ANGIO INTRA-EXTRA CRANIAL W IV CONTRAST performed on 03/27/2022 2:33 PM    CLINICAL HISTORY: FACIAL NUMBNESS.  facial numbness, headache, high bp    TECHNIQUE:  Head CT without intravenous contrast followed by CTA imaging of the head and neck from the aortic arch to the skull vertex with intravenous contrast.  3D reconstructions.    CONTRAST: 100 ml's of Omnipaque 350    COMPARISON:  None.  # of known CTs in the past 12 months:  0    # of known Cardiac Nuclear Medicine Studies in the past 12 months:  0    FINDINGS:  Aortic Arch: Normal size and branching pattern.  No significant  atherosclerotic plaque.  Brachiocephalic and Subclavians:  Unremarkable    Right Carotid:  Right CCA: Unremarkable  Right ICA: Unremarkable     Maximum stenosis (NASCET):  0 %  Right ECA: Unremarkable    Left Carotid:  Left CCA: Unremarkable  Left ICA: Unremarkable     Maximum stenosis (NASCET):  0 %  Left ECA: Unremarkable    Vertebrals:  Codominant. Arise from the subclavians. Both vertebrals form the basilar.  Right Vertebral: Unremarkable.  Left Vertebral: Unremarkable.    The anterior communicating artery is hypoplastic which is a normal anatomic variation.   No intracranial aneurysms or large vascular malformations are identified.  Anterior cerebral arteries:  Unremarkable.  Middle cerebral arteries:  Unremarkable    Basilar artery:  Unremarkable  Posterior cerebral arteries:  Unremarkable  Other major branches of the posterior circulation:  Unremarkable    Major venous structures:  Unremarkable        Impression    1.RIGHT CAROTID: UNREMARKABLE  2.LEFT CAROTID: UNREMARKABLE  3.VERTEBRALS: UNREMARKABLE  4.INTRACRANIAL: UNREMARKABLE  5.      One or more dose reduction techniques were used (e.g., Automated exposure control, adjustment of the mA and/or kV according to patient size, use of iterative reconstruction technique).      Radiologist location ID: [id number]         ED COURSE/MEDICAL DECISION MAKING  Medications Administered in the ED   magnesium sulfate 1 G in D5W 100 mL premix IVPB (has no administration in time range)   NS bolus infusion 1,000 mL (has no administration in time range)   NS premix infusion (has no administration in time range)   cloNIDine (CATAPRES) tablet (0.1 mg Oral Given 03/27/22 1247)   LORazepam (ATIVAN) 2 mg/mL injection (2 mg Intravenous Given 03/27/22 1410)   iohexol (OMNIPAQUE 350) infusion (100 mL Intravenous Given 03/27/22 1436)       ED Course as of 03/27/22 1607   Fri Mar 27, 2022   1256 ECG 12 LEAD  TODAY I, Kelcy Baeten A. Cici Rodriges, D.O., PERSONALLY VISUALIZED THE PATIENT'S EKG TRACING.  I AM THE PRIMARY INTERPRETER.  SINUS TACHYCARDIA RATE OF 102.  NO AV BLOCKS.  NORMAL AXIS.  BASELINE WANDERING WITH ARTIFACT.  NONSPECIFIC ST-T CHANGES.  NO LVH.  NO BUNDLE-BRANCH BLOCKS.  LOWER VOLTAGE.   1256 XR AP MOBILE CHEST  TODAY I, Dorthie Santini A. Caroll Weinheimer, D.O., PERSONALLY VISUALIZED THE PATIENT'S DIAGNOSTIC IMAGING STUDIES.  DEFER TO THE RADIOLOGIST'S REPORT FOR THE FINAL DEFINITIVE RADIOLOGY READING.  MY INITIAL INDEPENDENT INTERPRETATION IS AS FOLLOWS, BUT NOT LIMITED TO:  CHRONIC CHANGES     1604 SODIUM(!!): 118  LOW   1604 TROPONIN-I: 3  NORMAL   1605 SEDIMENTATION RATE: 1  NORMAL   1605 MAGNESIUM(!): 1.5  LOW      Medical Decision Making  Amount and/or Complexity of Data Reviewed  Labs: ordered. Decision-making details documented in ED Course.  Radiology: ordered and independent interpretation performed. Decision-making details documented in ED Course.  ECG/medicine tests: ordered and independent interpretation performed. Decision-making details documented in ED Course.    Risk  Prescription drug management.  Decision regarding hospitalization.  Diagnosis or treatment significantly limited by social determinants of health.    Critical Care  Total time providing critical care: 35 minutes    CLINICAL IMPRESSION  Clinical Impression   Acute hyponatremia (Primary)   Facial numbness   Anxiety attack   Cephalgia   Hypertensive urgency   Hypomagnesemia     DISPOSITION  Admitted  DISCHARGE MEDICATIONS  Current Discharge Medication List          /[place] Flett A. Alexander, DO, MBA   03/27/2022, 16:01   [facility]  Department of Emergency Medicine  [facility]    This note was partially generated using MModal Fluency Direct system, and there may be some incorrect words, spellings, and punctuation that were not noted in checking the note  before saving.

## 2022-03-27 NOTE — Consults (Signed)
[place] MEDICINE [facility]    NEPHROLOGY CONSULTATION NOTE       LORRIANE DEHART 46 y.o. female ED06/ED06   Date of Service: 03/27/2022    Date of Admission:  03/27/2022   PCP: Delice Bison, FNP-C Code Status:Full Code       Reason for Consultation:  symptomatic hyponatremia    HPI:   46 year old female with past medical history of prediabetes, hypertension, psychiatric disorder on multiple psychiatric medication presented to the hospital with new onset numbness weakness in the left upper and lower extremities associated with dizziness not feeling well, patient was brought into the hospital further evaluation indicated hyponatremia of sodium 118.  Patient received saline and her sodium dropped to 116.  Nephrology service was consulted.  I recommended moving the patient to the ICU with frequent sodium checks discontinue saline and starting the patient on 3% saline.  Patient denies to me vomiting reported some nausea.  Reported also changes in her vision.      ROS:   Systematic review of 12 organ systems was negative except what mentioned in in the HPI.    ED medications:   Medications Administered in the ED   magnesium sulfate 1 G in D5W 100 mL premix IVPB (0 g Intravenous Not Given 03/27/22 1430)   cloNIDine (CATAPRES) tablet (0.1 mg Oral Given 03/27/22 1247)   LORazepam (ATIVAN) 2 mg/mL injection (2 mg Intravenous Given 03/27/22 1410)   NS bolus infusion 1,000 mL (1,000 mL Intravenous New Bag/New Syringe 03/27/22 1545)   iohexol (OMNIPAQUE 350) infusion (100 mL Intravenous Given 03/27/22 1436)       PMHx:    Past Medical History:   Diagnosis Date    Anxiety     Bipolar disorder, unspecified (CMS HCC)     Constipation     Depression     Diabetes mellitus, type 2 (CMS HCC)     Esophageal reflux     High triglycerides     HTN (hypertension)     Hypercholesterolemia     Lumbar back pain     2 herniated discs    Sleep apnea     has CPAP        PSHx:   Past Surgical History:   Procedure Laterality Date    HX  APPENDECTOMY      HX HYSTERECTOMY      HX TUBAL LIGATION            Allergies:    Allergies   Allergen Reactions    Amoxicillin Nausea/ Vomiting    Iron Nausea/ Vomiting    Social History  Social History     Tobacco Use    Smoking status: Former     Types: Cigarettes    Smokeless tobacco: Never   Substance Use Topics    Alcohol use: Not Currently    Drug use: Not Currently       Family History  Family Medical History:       Problem Relation (Age of Onset)    Breast Cancer Maternal Aunt (23), Paternal Grandmother    Coronary Artery Disease Other    Endometrial cancer Other    Lung Cancer Other    Mental illness Other    No Known Problems Mother, Father, Sister, Brother, Maternal Uncle, Paternal Aunt, Paternal Uncle, Maternal Grandmother, Maternal Grandfather, Paternal Grandfather, Daughter, Son               Home Meds:      Prior to Admission  medications    Medication Sig Start Date End Date Taking? Authorizing Provider   atorvastatin (LIPITOR) 40 mg Oral Tablet Take 1 Tablet (40 mg total) by mouth Every evening   Yes Provider, Historical   busPIRone (BUSPAR) 15 mg Oral Tablet Take 1 Tablet (15 mg total) by mouth Three times a day   Yes Provider, Historical   diclofenac sodium (VOLTAREN) 75 mg Oral Tablet, Delayed Release (E.C.) Take 1 Tablet (75 mg total) by mouth Twice daily   Yes Provider, Historical   hydroCHLOROthiazide (HYDRODIURIL) 25 mg Oral Tablet Take 1 Tablet (25 mg total) by mouth Once a day   Yes Provider, Historical   hydrOXYzine HCL (ATARAX) 50 mg Oral Tablet Take 1 Tablet (50 mg total) by mouth Four times a day 12/02/21  Yes Provider, Historical   Ibuprofen (MOTRIN) 200 mg Oral Tablet Take 4 Tablets (800 mg total) by mouth Three times a day as needed   Yes Provider, Historical   Lisinopril (PRINIVIL) 30 mg Oral Tablet Take 1 Tablet (30 mg total) by mouth Once a day 01/17/22  Yes Provider, Historical   LORazepam (ATIVAN) 1 mg Oral Tablet Take 1 Tablet (1 mg total) by mouth Twice per day as needed for  Anxiety   Yes Provider, Historical   metFORMIN (GLUCOPHAGE) 500 mg Oral Tablet Take 1 Tablet (500 mg total) by mouth Every morning with breakfast   Yes Provider, Historical   metoprolol succinate (TOPROL-XL) 25 mg Oral Tablet Sustained Release 24 hr Take 1 Tablet (25 mg total) by mouth Once a day 11/09/21  Yes Provider, Historical   metoprolol tartrate (LOPRESSOR) 25 mg Oral Tablet Take 1 Tablet (25 mg total) by mouth Once a day   Yes Provider, Historical   Mirtazapine (REMERON) 45 mg Oral Tablet Take 1 Tablet (45 mg total) by mouth Every night 12/02/21  Yes Provider, Historical   omeprazole (PRILOSEC) 20 mg Oral Capsule, Delayed Release(E.C.) Take 1 Capsule (20 mg total) by mouth Once a day   Yes Provider, Historical   ondansetron (ZOFRAN) 4 mg Oral Tablet Take 1 Tablet (4 mg total) by mouth Once per day as needed 01/06/22  Yes Provider, Historical   OXcarbazepine (TRILEPTAL) 300 mg Oral Tablet Take 1 Tablet (300 mg total) by mouth Twice daily 12/15/21  Yes Provider, Historical   QUEtiapine (SEROQUEL) 400 mg Oral Tablet Take 1 Tablet (400 mg total) by mouth Every night   Yes Provider, Historical   QUEtiapine (SEROQUEL) 50 mg Oral Tablet Take 1 Tablet (50 mg total) by mouth Twice daily   Yes Provider, Historical   ACCU-CHEK GUIDE TEST STRIPS Does not apply Strip CHECK BLOOD SUGAR ONCE A DAY AND AS NEEDED 08/21/21 03/27/22  Provider, Historical   ezetimibe (ZETIA) 10 mg Oral Tablet Take 1 Tablet (10 mg total) by mouth Every evening  03/27/22  Provider, Historical   hydrOXYzine pamoate (VISTARIL) 50 mg Oral Capsule Take 1 Capsule (50 mg total) by mouth Four times a day  03/27/22  Provider, Historical          Current medications   3% hypertonic sodium chloride infusion, 25 mL/hr, Intravenous, Continuous  acetaminophen (TYLENOL) tablet, 650 mg, Oral, Q4H PRN  hydrALAZINE (APRESOLINE) injection 10 mg, 10 mg, Intravenous, Q6H PRN  NS flush syringe, 3 mL, Intracatheter, Q8HRS  NS flush syringe, 3 mL, Intracatheter, Q1H  PRN  ondansetron (ZOFRAN) 2 mg/mL injection, 4 mg, Intravenous, Q6H PRN            Physical:  Filed Vitals:  03/27/22 1400 03/27/22 1415 03/27/22 1430 03/27/22 1445   BP: (!) 159/98 (!) 146/94     Pulse: 97 84 93 85   Resp: (!) 26 17 (!) 10    Temp:       SpO2: 100% 95% 93% 97%        Intake/Output Summary (Last 24 hours) at 03/27/2022 1801  Last data filed at 03/27/2022 1731  Gross per 24 hour   Intake 1000 ml   Output --   Net 1000 ml        Patient is alert awake and oriented not in acute distress.  Normal mood and affect.  HEENT normocephalic atraumatic.  Eye exam normal inspection.    Mucous membranes moist, no jaundice.  Neck exam no JVD normal inspection.  Cardiovascular system: Regular rate and rhythm no murmurs rubs or gallops. No chest wall tenderness  Lungs: Clear to auscultation bilaterally no wheezing no rhonchi.  Abdomen soft nontender nondistended.  Extremities no clubbing cyanosis or edema   Neuro exam: EOMI, normal speech    Labs:  CBC:     5.2 (07/28 1212) \   12.1* (07/28 1212) /   325 (07/28 1212)      / 35.1* (07/28 1212) \          BMP:   116* (07/28 1259) 85* (07/28 1259) 11 (07/28 1259)    /     126* (07/28 1259)   4.3 (07/28 1259) 19* (07/28 1259) 1.09 (07/28 1259) \                 Diagnostic studies:  Imaging:   CT ANGIO INTRA-EXTRA CRANIAL W IV CONTRAST  Narrative: Sherron Ales Stgermaine    RADIOLOGIST: Alvester Chou, MD    CT ANGIO INTRA-EXTRA CRANIAL W IV CONTRAST performed on 03/27/2022 2:33 PM    CLINICAL HISTORY: FACIAL NUMBNESS.  facial numbness, headache, high bp    TECHNIQUE:  Head CT without intravenous contrast followed by CTA imaging of the head and neck from the aortic arch to the skull vertex with intravenous contrast.  3D reconstructions.    CONTRAST: 100 ml's of Omnipaque 350    COMPARISON:  None.  # of known CTs in the past 12 months:  0   # of known Cardiac Nuclear Medicine Studies in the past 12 months:  0    FINDINGS:  Aortic Arch: Normal size and branching pattern.  No  significant atherosclerotic plaque.  Brachiocephalic and Subclavians:  Unremarkable    Right Carotid:  Right CCA: Unremarkable  Right ICA: Unremarkable     Maximum stenosis (NASCET):  0 %  Right ECA: Unremarkable    Left Carotid:  Left CCA: Unremarkable  Left ICA: Unremarkable     Maximum stenosis (NASCET):  0 %  Left ECA: Unremarkable    Vertebrals:  Codominant. Arise from the subclavians. Both vertebrals form the basilar.  Right Vertebral: Unremarkable.  Left Vertebral: Unremarkable.    The anterior communicating artery is hypoplastic which is a normal anatomic variation.   No intracranial aneurysms or large vascular malformations are identified.  Anterior cerebral arteries:  Unremarkable.  Middle cerebral arteries:  Unremarkable    Basilar artery:  Unremarkable  Posterior cerebral arteries:  Unremarkable  Other major branches of the posterior circulation:  Unremarkable    Major venous structures:  Unremarkable  Impression: 1.RIGHT CAROTID: UNREMARKABLE  2.LEFT CAROTID: UNREMARKABLE  3.VERTEBRALS: UNREMARKABLE  4.INTRACRANIAL: UNREMARKABLE  5.    One or more dose reduction techniques  were used (e.g., Automated exposure control, adjustment of the mA and/or kV according to patient size, use of iterative reconstruction technique).    Radiologist location ID: [id number]  XR AP MOBILE CHEST  Narrative: Sherron Ales Bohr    RADIOLOGIST: Wynema Birch, MD    XR AP MOBILE CHEST performed on 03/27/2022 12:52 PM    CLINICAL HISTORY: FEVER.  fever, dizziness    TECHNIQUE: Frontal view of the chest.    COMPARISON:  10/11/2014    FINDINGS:    The heart size is normal.   The lungs are clear.   Impression: NEGATIVE CHEST    Radiologist location ID: [id number]  CT BRAIN WO IV CONTRAST  Narrative: Sherron Ales Masih    RADIOLOGIST: Alvester Chou, MD    CT BRAIN WO IV CONTRAST performed on 03/27/2022 12:24 PM    CLINICAL HISTORY: headache.  HA, NUMBNESS, AMS, DIZZINESS    TECHNIQUE:  Head CT without intravenous contrast.    COMPARISON:  None.  # of known CTs in the past 12 months:  0   # of known Cardiac Nuclear Medicine Studies in the past 12 months:  0    FINDINGS:  There is no acute intracranial hemorrhage, mass effect, or evidence of large acute infarct.    Brain: Normal    CSF Spaces: Normal     Sinuses/Mastoids:  Clear at visualized levels     Bones: Unremarkable  Impression: NO ACUTE FINDINGS    One or more dose reduction techniques were used (e.g., Automated exposure control, adjustment of the mA and/or kV according to patient size, use of iterative reconstruction technique).    Radiologist location ID: [id number]      Assessments:  Active Hospital Problems   (*Primary Problem)    Diagnosis    *Hyponatremia    Hypomagnesemia    Elevated CPK    Uncontrolled hypertension    Acute hyponatremia       Plan:     Hyponatremia  -euvolemic hyponatremia  -patient is on HCTZ and Trileptal  -SIADH is a possibility  -fluid restriction  -3% saline at 25 cc an hour  -sodium checks every 3-4 hours  -monitoring in the ICU  -patient is high risk for seizures  -discussed the plan of care with the hospitalist team, the emergency room staff, appreciate the assistance from Dr. Earlene Plater.  -fluid restriction 1000 cc a day  -urine sodium and urine osmolarity  -patient reports family history of lung cancer chest x-ray reviewed  -TSH with a normal  -hold HCTZ and Trileptal for now    MY ORDERS LAST 24 (24h ago, onward)       Start     Ordered    03/27/22 1815  DIET CARDIAC (2G NA, LOWFAT, LOW CHOL) Do you want to initiate MNT Protocol? Yes; Calorie amount: CC 1800; Restrict fluids to: 1000 ML  ALL MEALS         03/27/22 1800                                Rhina Brackett, MD, FASN, 03/27/2022, 18:01

## 2022-03-27 NOTE — ED Nurses Note (Signed)
Report called to [facility] in CCU

## 2022-03-27 NOTE — ED Nurses Note (Signed)
Bedside report given to Jake, RN

## 2022-03-27 NOTE — ED Triage Notes (Signed)
Headache and left side numbness x 30 minutes with history of high blood pressure. 222/120 BP obtained by coworkers and EMS reports 192/135 BP    PRS:  18g left ac, NS KVO, BS 105

## 2022-03-27 NOTE — H&P (Signed)
[place] [facility]    HOSPITALIST H&P    Miranda Young 46 y.o. female ED06/ED06   Date of Service: 03/27/2022    Date of Admission:  03/27/2022   PCP: Chauncy Lean, FNP-C Code Status:Full Code       Chief Complaint:  " headache, dizziness, nausea, vomiting, blurry vision, tingling of fingers and toes "    HPI:   Miranda Miyamoto. Young is a 46 year old female who presented to the ER on 03/27/22 with complaints of severe headache at top of head, dizziness, nausea, vomiting, burry vision, and tingling of fingers and toes.  The nausea and tingling of fingers and toes have been intermittent for a while, but the other symptoms started this morning.  She denies any seizure activity, confusion, fever, chills, body aches, SOB.  The patient stated that she had elevated blood pressure at home as high as 476'L systolic.  She takes lisinopril and metoprolol daily at 2:30 pm and HCTZ at bedtime.  She has not had her medications yet today.  The patient was given ativan in ER with some improvement in symptoms.  Labs upon arrival to ER were as follows:  Hgb 12.1, sodium 118, glucose 130, magnesium 1.5, CK 399, lactic acid 1.8.  CXR negative.  CT head negative.  The patient was seen and examined for hospitalist admission while in ER.  Family member at bedside.          ED medications:   Medications Administered in the ED   magnesium sulfate 1 G in D5W 100 mL premix IVPB (0 g Intravenous Not Given 03/27/22 1430)   cloNIDine (CATAPRES) tablet (0.1 mg Oral Given 03/27/22 1247)   LORazepam (ATIVAN) 2 mg/mL injection (2 mg Intravenous Given 03/27/22 1410)   NS bolus infusion 1,000 mL (1,000 mL Intravenous New Bag/New Syringe 03/27/22 1545)   iohexol (OMNIPAQUE 350) infusion (100 mL Intravenous Given 03/27/22 1436)         PMHx:    Past Medical History:   Diagnosis Date    Anxiety     Bipolar disorder, unspecified (CMS HCC)     Constipation     Depression     Diabetes mellitus, type 2 (CMS HCC)     Esophageal reflux     High  triglycerides     HTN (hypertension)     Hypercholesterolemia     Lumbar back pain     2 herniated discs    Sleep apnea     has CPAP        PSHx:   Past Surgical History:   Procedure Laterality Date    HX APPENDECTOMY      HX HYSTERECTOMY      HX TUBAL LIGATION            Allergies:    Allergies   Allergen Reactions    Amoxicillin Nausea/ Vomiting    Iron Nausea/ Vomiting    Social History  Social History     Tobacco Use    Smoking status: Former     Types: Cigarettes    Smokeless tobacco: Never   Substance Use Topics    Alcohol use: Not Currently    Drug use: Not Currently       Family History  Family Medical History:       Problem Relation (Age of Onset)    Breast Cancer Maternal Aunt (23), Paternal Grandmother    Coronary Artery Disease Other    Endometrial cancer Other  Lung Cancer Other    Mental illness Other    No Known Problems Mother, Father, Sister, Brother, Maternal Uncle, Paternal 54, Paternal Uncle, Maternal Grandmother, Maternal Grandfather, Paternal 81, Daughter, Son               Home Meds:      Prior to Admission medications    Medication Sig Start Date End Date Taking? Authorizing Provider   atorvastatin (LIPITOR) 40 mg Oral Tablet Take 1 Tablet (40 mg total) by mouth Every evening   Yes Provider, Historical   busPIRone (BUSPAR) 15 mg Oral Tablet Take 1 Tablet (15 mg total) by mouth Three times a day   Yes Provider, Historical   diclofenac sodium (VOLTAREN) 75 mg Oral Tablet, Delayed Release (E.C.) Take 1 Tablet (75 mg total) by mouth Twice daily   Yes Provider, Historical   hydroCHLOROthiazide (HYDRODIURIL) 25 mg Oral Tablet Take 1 Tablet (25 mg total) by mouth Once a day   Yes Provider, Historical   hydrOXYzine HCL (ATARAX) 50 mg Oral Tablet Take 1 Tablet (50 mg total) by mouth Four times a day 12/02/21  Yes Provider, Historical   Ibuprofen (MOTRIN) 200 mg Oral Tablet Take 4 Tablets (800 mg total) by mouth Three times a day as needed   Yes Provider, Historical   Lisinopril (PRINIVIL)  30 mg Oral Tablet Take 1 Tablet (30 mg total) by mouth Once a day 01/17/22  Yes Provider, Historical   LORazepam (ATIVAN) 1 mg Oral Tablet Take 1 Tablet (1 mg total) by mouth Twice per day as needed for Anxiety   Yes Provider, Historical   metFORMIN (GLUCOPHAGE) 500 mg Oral Tablet Take 1 Tablet (500 mg total) by mouth Every morning with breakfast   Yes Provider, Historical   metoprolol succinate (TOPROL-XL) 25 mg Oral Tablet Sustained Release 24 hr Take 1 Tablet (25 mg total) by mouth Once a day 11/09/21  Yes Provider, Historical   metoprolol tartrate (LOPRESSOR) 25 mg Oral Tablet Take 1 Tablet (25 mg total) by mouth Once a day   Yes Provider, Historical   Mirtazapine (REMERON) 45 mg Oral Tablet Take 1 Tablet (45 mg total) by mouth Every night 12/02/21  Yes Provider, Historical   omeprazole (PRILOSEC) 20 mg Oral Capsule, Delayed Release(E.C.) Take 1 Capsule (20 mg total) by mouth Once a day   Yes Provider, Historical   ondansetron (ZOFRAN) 4 mg Oral Tablet Take 1 Tablet (4 mg total) by mouth Once per day as needed 01/06/22  Yes Provider, Historical   OXcarbazepine (TRILEPTAL) 300 mg Oral Tablet Take 1 Tablet (300 mg total) by mouth Twice daily 12/15/21  Yes Provider, Historical   QUEtiapine (SEROQUEL) 400 mg Oral Tablet Take 1 Tablet (400 mg total) by mouth Every night   Yes Provider, Historical   QUEtiapine (SEROQUEL) 50 mg Oral Tablet Take 1 Tablet (50 mg total) by mouth Twice daily   Yes Provider, Historical   ACCU-CHEK GUIDE TEST STRIPS Does not apply Strip CHECK BLOOD SUGAR ONCE A DAY AND AS NEEDED 08/21/21 03/27/22  Provider, Historical   ezetimibe (ZETIA) 10 mg Oral Tablet Take 1 Tablet (10 mg total) by mouth Every evening  03/27/22  Provider, Historical   hydrOXYzine pamoate (VISTARIL) 50 mg Oral Capsule Take 1 Capsule (50 mg total) by mouth Four times a day  03/27/22  Provider, Historical          ROS:   The hospitalist examined the patient, reviewed material, and agreed with discharge at this time.  This discharge  took greater than 30 minutes.      Results for orders placed or performed during the hospital encounter of 03/27/22 (from the past 24 hour(s))   TROPONIN-I   Result Value Ref Range    TROPONIN I 3 <15 ng/L   THYROID STIMULATING HORMONE WITH FREE T4 REFLEX   Result Value Ref Range    TSH 4.157 0.450 - 5.330 uIU/mL   LACTIC ACID LEVEL W/ REFLEX FOR LEVEL >2.0   Result Value Ref Range    LACTIC ACID 1.8 0.5 - 2.2 mmol/L   MAGNESIUM   Result Value Ref Range    MAGNESIUM 1.5 (L) 1.9 - 2.7 mg/dL   PT/INR   Result Value Ref Range    PROTHROMBIN TIME 12.0 9.8 - 12.7 seconds    INR 1.03 <=5.00   PTT (PARTIAL THROMBOPLASTIN TIME)   Result Value Ref Range    APTT 32.0 26.0 - 36.0 seconds   COMPREHENSIVE METABOLIC PANEL, NON-FASTING   Result Value Ref Range    SODIUM 118 (LL) 136 - 145 mmol/L    POTASSIUM 4.3 3.5 - 5.1 mmol/L    CHLORIDE 87 (L) 98 - 107 mmol/L    CO2 TOTAL 23 21 - 31 mmol/L    ANION GAP 8 (L) 10 - 20 mmol/L    BUN 11 7 - 25 mg/dL    CREATININE 1.05 0.60 - 1.30 mg/dL    BUN/CREA RATIO 10 6 - 22    ESTIMATED GFR 66 >59 mL/min/1.36m2    ALBUMIN 4.3 3.5 - 5.7 g/dL    CALCIUM 8.6 8.6 - 10.3 mg/dL    GLUCOSE 130 (H) 74 - 109 mg/dL    ALKALINE PHOSPHATASE 90 34 - 104 U/L    ALT (SGPT) 18 7 - 52 U/L    AST (SGOT) 19 13 - 39 U/L    BILIRUBIN TOTAL 0.3 0.3 - 1.2 mg/dL    PROTEIN TOTAL 6.7 6.4 - 8.9 g/dL    ALBUMIN/GLOBULIN RATIO 1.8 (H) 0.8 - 1.4    OSMOLALITY, CALCULATED 240 (L) 270 - 290 mOsm/kg    CALCIUM, CORRECTED 8.3 (L) 8.9 - 10.8 mg/dL    GLOBULIN 2.4 (L) 2.9 - 5.4   C-REACTIVE PROTEIN (CRP)   Result Value Ref Range    C-REACTIVE PROTEIN (CRP) 0.4 0.1 - 0.5 mg/dL   CREATINE KINASE (CK), TOTAL, SERUM   Result Value Ref Range    CREATINE KINASE 399 (H) 30 - 223 U/L   ETHANOL, SERUM   Result Value Ref Range    ETHANOL <10 (H) 0 mg/dL   CBC WITH DIFF   Result Value Ref Range    WBCS UNCORRECTED 5.2 x10^3/uL    WBC 5.2 4.0 - 10.5 x10^3/uL    RBC 4.49 4.20 - 5.40 x10^6/uL    HGB 12.1 (L) 12.5 - 16.0 g/dL    HCT 35.1  (L) 37.0 - 47.0 %    MCV 78.2 78.0 - 99.0 fL    MCH 27.0 27.0 - 32.0 pg    MCHC 34.5 32.0 - 36.0 g/dL    RDW 12.4 11.6 - 14.8 %    PLATELETS 325 140 - 440 x10^3/uL    MPV 6.4 (L) 7.4 - 10.4 fL    NEUTROPHIL % 52 40 - 76 %    LYMPHOCYTE % 40 25 - 45 %    MONOCYTE % 6 0 - 12 %    EOSINOPHIL % 1 0 - 7 %    BASOPHIL % 1 0 - 3 %  NEUTROPHIL # 2.70 1.80 - 8.40 x10^3/uL    LYMPHOCYTE # 2.10 1.10 - 5.00 x10^3/uL    MONOCYTE # 0.30 0.00 - 1.30 x10^3/uL    EOSINOPHIL # 0.10 0.00 - 0.80 x10^3/uL    BASOPHIL # 0.00 0.00 - 0.30 x10^3/uL   SEDIMENTATION RATE   Result Value Ref Range    ERYTHROCYTE SEDIMENTATION RATE (ESR) 1 <20 mm/hr   AMMONIA   Result Value Ref Range    AMMONIA 26 16 - 53 umol/L   BASIC METABOLIC PANEL   Result Value Ref Range    SODIUM 116 (LL) 136 - 145 mmol/L    POTASSIUM 4.3 3.5 - 5.1 mmol/L    CHLORIDE 85 (L) 98 - 107 mmol/L    CO2 TOTAL 19 (L) 21 - 31 mmol/L    ANION GAP 12 10 - 20 mmol/L    CALCIUM 8.6 8.6 - 10.3 mg/dL    GLUCOSE 126 (H) 74 - 109 mg/dL    BUN 11 7 - 25 mg/dL    CREATININE 1.09 0.60 - 1.30 mg/dL    BUN/CREA RATIO 10 6 - 22    ESTIMATED GFR 63 >59 mL/min/1.56m2    OSMOLALITY, CALCULATED 236 (L) 270 - 290 mOsm/kg   URINE DRUG SCREEN   Result Value Ref Range    AMPHET QL Negative Negative    BARB QL Negative Negative    BENZO QL Negative Negative    COCQL Negative Negative    METHQL Negative Negative    OPIATE Negative Negative    PCP QL Negative Negative    CANNAQL Negative Negative    BUP QL Negative Negative    OXYCODONE URINE Negative Negative    TRICYCLIC ANTIDEPRESSANTS URINE Positive (A) Negative          Physical:  Filed Vitals:    03/27/22 1400 03/27/22 1415 03/27/22 1430 03/27/22 1445   BP: (!) 159/98 (!) 146/94     Pulse: 97 84 93 85   Resp: (!) 26 17 (!) 10    Temp:       SpO2: 100% 95% 93% 97%      General: Patient is alert and oriented to person, place, and time. No acute distress. Communicates appropriately.   Head: Normocephalic and atraumatic.    Eyes: Pupils equally round  and react to light and accommodate. Extraocular movements intact.  Conjunctiva normal. Sclerae are normal.    Nose: Nasal passages clear. Mucosa moist.    Throat: Moist oral mucosa. No erythema or exudate of the pharynx. Clear oropharynx.    Neck: Supple. No cervical lymphadenopathy or supraclavicular nodes detected. Trachea midline   Heart: Regular rate and rhythm. S1 & S2 present. No S3 or S4. No rubs, gallops, or murmurs appreciated.  Radial and dorsalis pedis pulses +2/4 bilaterally.  Brisk capillary refill.    Lungs: Clear to auscultation bilaterally with no wheezes or rales. Equal chest excursion.  No conversational dyspnea. No respiratory distress noted.   Abdomen: Soft, nontender, nondistended belly. Bowel sounds are present in all four quadrants. No rigidity.  No guarding.  No ascites.   Extremities: No edema, cyanosis, or clubbing. Grossly moves all extremities.    Skin: Warm and dry without lesions. No ecchymosis noted.    Neurologic: Cranial nerves II through XII are grossly intact. Sensation to light touch is intact. Strength 5/5 in upper extremities and lower extremities bilaterally.    Genitourinary:  No urinary incontinence or Foley catheter   Psychiatric: Judgment and insight are intact. Mood  and affect are appropriate for the situation.         Assessments:  Active Hospital Problems   (*Primary Problem)    Diagnosis    *Hyponatremia    Hypomagnesemia    Elevated CPK    Uncontrolled hypertension    Acute hyponatremia        Hyponatremia  The patient will be admitted to ICU/CCU due to needing 3% saline @ 25 ml/hr.  BMPL will be checked q 2 hrs while on hypertonic saline drip.  Neuro checks ordered Q4 hours.  Nephrology following patient as well    2.  Hypomagnesemia   Electrolytes replaced as appropriate.  Labs ordered for am.      3.  Elevated cpk   Only 399 at this time.  Level will be repeated in am.      4.  Uncontrolled hypertension   Home medications restarted.  Hydralazine ordered prn for SBP  > 180 and/or DBP > 100.      5.  Sleep apnea   Patient states that she has had CPAP in past, but not currently.  She stated that she had a colonoscopy on 03/06/22 and was told by anesthesia that she needed to get as CPAP ASAP.  She has appointment on 04/18/22 with Dr. Linton Rump for sleep study.  CPAP 8 ordered for this admission.            Plan:  Patient will be admitted to ICU/CCU for the above problems.  Home medications will be restarted as appropriate once confirmed.  Labs ordered for am.  Further orders will depend upon clinical course.  The Hospitalist personally evaluated and examined the patient in conjunction with the MLP and agree with the assessments, treatment plan and disposition of the patient as recorded by the [facility].      Code status: Full Code  DVT prophylaxis: SCD    Diet: DIET CARDIAC (2G NA, LOWFAT, LOW CHOL) Do you want to initiate MNT Protocol? Yes; Calorie amount: CC 1800; Restrict fluids to: 1000 ML    Disposition:  The patient is currently acutely ill requiring treatment on the ICU/CCU for inpatient admission. Patient will be closely evaluated monitor and remove be adjusted accordingly.  Estimated length of stay greater than 48 hr to obtain full medical treatment.      Evette Doffing, FNP-BC    [facility] HOSPITALIST    Patient was seen and examined as part of a shared visit with the APP.  I personally spent more than 50% of the encounter with direct, face-to-face patient care including: obtaining history, performing physical exam, counseling the patient and family, and directing medical decision making.    --Gar Ponto, DO

## 2022-03-28 DIAGNOSIS — G4730 Sleep apnea, unspecified: Secondary | ICD-10-CM

## 2022-03-28 DIAGNOSIS — Z6832 Body mass index (BMI) 32.0-32.9, adult: Secondary | ICD-10-CM

## 2022-03-28 DIAGNOSIS — E871 Hypo-osmolality and hyponatremia: Secondary | ICD-10-CM

## 2022-03-28 DIAGNOSIS — I10 Essential (primary) hypertension: Secondary | ICD-10-CM

## 2022-03-28 DIAGNOSIS — R748 Abnormal levels of other serum enzymes: Secondary | ICD-10-CM

## 2022-03-28 LAB — BASIC METABOLIC PANEL
ANION GAP: 10 mmol/L (ref 10–20)
ANION GAP: 7 mmol/L — ABNORMAL LOW (ref 10–20)
ANION GAP: 5 mmol/L — ABNORMAL LOW (ref 10–20)
ANION GAP: 8 mmol/L — ABNORMAL LOW (ref 10–20)
ANION GAP: 7 mmol/L — ABNORMAL LOW (ref 10–20)
BUN/CREA RATIO: 12 (ref 6–22)
BUN/CREA RATIO: 12 (ref 6–22)
BUN/CREA RATIO: 13 (ref 6–22)
BUN/CREA RATIO: 11 (ref 6–22)
BUN/CREA RATIO: 11 (ref 6–22)
BUN: 10 mg/dL (ref 7–25)
BUN: 10 mg/dL (ref 7–25)
BUN: 12 mg/dL (ref 7–25)
BUN: 11 mg/dL (ref 7–25)
BUN: 11 mg/dL (ref 7–25)
CALCIUM: 8.7 mg/dL (ref 8.6–10.3)
CALCIUM: 8.1 mg/dL — ABNORMAL LOW (ref 8.6–10.3)
CALCIUM: 8.3 mg/dL — ABNORMAL LOW (ref 8.6–10.3)
CALCIUM: 8.4 mg/dL — ABNORMAL LOW (ref 8.6–10.3)
CALCIUM: 8 mg/dL — ABNORMAL LOW (ref 8.6–10.3)
CHLORIDE: 89 mmol/L — ABNORMAL LOW (ref 98–107)
CHLORIDE: 91 mmol/L — ABNORMAL LOW (ref 98–107)
CHLORIDE: 92 mmol/L — ABNORMAL LOW (ref 98–107)
CHLORIDE: 94 mmol/L — ABNORMAL LOW (ref 98–107)
CHLORIDE: 97 mmol/L — ABNORMAL LOW (ref 98–107)
CO2 TOTAL: 21 mmol/L (ref 21–31)
CO2 TOTAL: 22 mmol/L (ref 21–31)
CO2 TOTAL: 24 mmol/L (ref 21–31)
CO2 TOTAL: 22 mmol/L (ref 21–31)
CO2 TOTAL: 23 mmol/L (ref 21–31)
CREATININE: 0.84 mg/dL (ref 0.60–1.30)
CREATININE: 0.82 mg/dL (ref 0.60–1.30)
CREATININE: 0.96 mg/dL (ref 0.60–1.30)
CREATININE: 0.96 mg/dL (ref 0.60–1.30)
CREATININE: 0.99 mg/dL (ref 0.60–1.30)
ESTIMATED GFR: 86 mL/min/{1.73_m2} (ref >59)
ESTIMATED GFR: 89 mL/min/{1.73_m2} (ref >59)
ESTIMATED GFR: 73 mL/min/{1.73_m2} (ref >59)
ESTIMATED GFR: 73 mL/min/{1.73_m2} (ref >59)
ESTIMATED GFR: 71 mL/min/{1.73_m2} (ref >59)
GLUCOSE: 108 mg/dL (ref 74–109)
GLUCOSE: 154 mg/dL — ABNORMAL HIGH (ref 74–109)
GLUCOSE: 124 mg/dL — ABNORMAL HIGH (ref 74–109)
GLUCOSE: 155 mg/dL — ABNORMAL HIGH (ref 74–109)
GLUCOSE: 135 mg/dL — ABNORMAL HIGH (ref 74–109)
OSMOLALITY, CALCULATED: 242 mOsm/kg — ABNORMAL LOW (ref 270–290)
OSMOLALITY, CALCULATED: 244 mOsm/kg — ABNORMAL LOW (ref 270–290)
OSMOLALITY, CALCULATED: 245 mOsm/kg — ABNORMAL LOW (ref 270–290)
OSMOLALITY, CALCULATED: 252 mOsm/kg — ABNORMAL LOW (ref 270–290)
OSMOLALITY, CALCULATED: 257 mOsm/kg — ABNORMAL LOW (ref 270–290)
POTASSIUM: 3.7 mmol/L (ref 3.5–5.1)
POTASSIUM: 3.6 mmol/L (ref 3.5–5.1)
POTASSIUM: 3.9 mmol/L (ref 3.5–5.1)
POTASSIUM: 4 mmol/L (ref 3.5–5.1)
POTASSIUM: 4 mmol/L (ref 3.5–5.1)
SODIUM: 120 mmol/L — ABNORMAL LOW (ref 136–145)
SODIUM: 120 mmol/L — ABNORMAL LOW (ref 136–145)
SODIUM: 121 mmol/L — ABNORMAL LOW (ref 136–145)
SODIUM: 124 mmol/L — ABNORMAL LOW (ref 136–145)
SODIUM: 127 mmol/L — ABNORMAL LOW (ref 136–145)

## 2022-03-28 LAB — CBC WITH DIFF
BASOPHIL #: 0 10*3/uL (ref 0.00–0.30)
BASOPHIL %: 1 % (ref 0–3)
EOSINOPHIL #: 0 10*3/uL (ref 0.00–0.80)
EOSINOPHIL %: 1 % (ref 0–7)
HCT: 30 % — ABNORMAL LOW (ref 37.0–47.0)
HGB: 10.2 g/dL — ABNORMAL LOW (ref 12.5–16.0)
LYMPHOCYTE #: 1.6 10*3/uL (ref 1.10–5.00)
LYMPHOCYTE %: 24 % — ABNORMAL LOW (ref 25–45)
MCH: 26.7 pg — ABNORMAL LOW (ref 27.0–32.0)
MCHC: 33.9 g/dL (ref 32.0–36.0)
MCV: 78.5 fL (ref 78.0–99.0)
MONOCYTE #: 0.4 10*3/uL (ref 0.00–1.30)
MONOCYTE %: 6 % (ref 0–12)
MPV: 6.4 fL — ABNORMAL LOW (ref 7.4–10.4)
NEUTROPHIL #: 4.5 10*3/uL (ref 1.80–8.40)
NEUTROPHIL %: 69 % (ref 40–76)
PLATELETS: 276 10*3/uL (ref 140–440)
RBC: 3.82 10*6/uL — ABNORMAL LOW (ref 4.20–5.40)
RDW: 12.5 % (ref 11.6–14.8)
WBC: 6.5 10*3/uL (ref 4.0–10.5)
WBCS UNCORRECTED: 6.5 10*3/uL

## 2022-03-28 LAB — ECG 12 LEAD
Atrial Rate: 102 {beats}/min
Calculated P Axis: 58 degrees
Calculated R Axis: 6 degrees
Calculated T Axis: 43 degrees
PR Interval: 186 ms
QRS Duration: 90 ms
QT Interval: 352 ms
QTC Calculation: 458 ms
Ventricular rate: 102 {beats}/min

## 2022-03-28 LAB — CREATINE KINASE (CK), TOTAL, SERUM: CREATINE KINASE: 365 U/L — ABNORMAL HIGH (ref 30–223)

## 2022-03-28 LAB — MAGNESIUM
MAGNESIUM: 1.8 mg/dL — ABNORMAL LOW (ref 1.9–2.7)
MAGNESIUM: 1.6 mg/dL — ABNORMAL LOW (ref 1.9–2.7)

## 2022-03-28 LAB — HGA1C (HEMOGLOBIN A1C WITH EST AVG GLUCOSE): HEMOGLOBIN A1C: 5.8 % (ref 4.0–6.0)

## 2022-03-28 LAB — POC BLOOD GLUCOSE (RESULTS)
GLUCOSE, POC: 116 mg/dl (ref 50–500)
GLUCOSE, POC: 144 mg/dl (ref 50–500)
GLUCOSE, POC: 125 mg/dl (ref 50–500)

## 2022-03-28 LAB — OSMOLALITY, RANDOM URINE: OSMOLALITY URINE: 313 mOsm/kg (ref 50–1400)

## 2022-03-28 MED ORDER — SODIUM CHLORIDE 3 % HYPERTONIC INTRAVENOUS INJECTION SOLUTION
25.0000 mL/h | INTRAVENOUS | Status: DC
Start: 2022-03-28 — End: 2022-03-28
  Filled 2022-03-28: qty 500

## 2022-03-28 MED ORDER — HYDROXYZINE PAMOATE 50 MG CAPSULE
50.0000 mg | ORAL_CAPSULE | Freq: Four times a day (QID) | ORAL | Status: DC
Start: 2022-03-28 — End: 2022-03-29
  Administered 2022-03-28 – 2022-03-29 (×4): 50 mg via ORAL
  Filled 2022-03-28 (×4): qty 1

## 2022-03-28 MED ORDER — MECLIZINE 25 MG TABLET
12.5000 mg | ORAL_TABLET | Freq: Three times a day (TID) | ORAL | Status: DC | PRN
Start: 2022-03-28 — End: 2022-03-29
  Administered 2022-03-28 – 2022-03-29 (×2): 12.5 mg via ORAL
  Filled 2022-03-28 (×2): qty 1

## 2022-03-28 MED ORDER — OXCARBAZEPINE 300 MG TABLET
600.0000 mg | ORAL_TABLET | Freq: Two times a day (BID) | ORAL | Status: DC
Start: 2022-03-28 — End: 2022-03-29
  Administered 2022-03-28 – 2022-03-29 (×3): 600 mg via ORAL
  Filled 2022-03-28 (×3): qty 2

## 2022-03-28 MED ORDER — SODIUM CHLORIDE 1,000 MG SOLUBLE TABLET
1.0000 g | ORAL_TABLET | Freq: Once | Status: AC
Start: 2022-03-28 — End: 2022-03-28
  Administered 2022-03-28: 1 g via ORAL
  Filled 2022-03-28: qty 1

## 2022-03-28 MED ORDER — MAGNESIUM OXIDE 400 MG (241.3 MG MAGNESIUM) TABLET
400.0000 mg | ORAL_TABLET | Freq: Two times a day (BID) | ORAL | Status: DC
Start: 2022-03-28 — End: 2022-03-29
  Administered 2022-03-28 – 2022-03-29 (×2): 400 mg via ORAL
  Filled 2022-03-28 (×2): qty 1

## 2022-03-28 NOTE — Transitional Care (Cosign Needed)
Care of the patient was transferred to Dr.White on 03/28/22.    HPI:   Miranda Young is a 46 year old female who presented to the ER on 03/27/22 with complaints of severe headache at top of head, dizziness, nausea, vomiting, burry vision, and tingling of fingers and toes.  The nausea and tingling of fingers and toes have been intermittent for a while, but the other symptoms started this morning.  She denies any seizure activity, confusion, fever, chills, body aches, SOB.  The patient stated that she had elevated blood pressure at home as high as 200's systolic.  She takes lisinopril and metoprolol daily at 2:30 pm and HCTZ at bedtime.  She has not had her medications yet today.  The patient was given ativan in ER with some improvement in symptoms.  Labs upon arrival to ER were as follows:  Hgb 12.1, sodium 118, glucose 130, magnesium 1.5, CK 399, lactic acid 1.8.  CXR negative.  CT head negative.   Nephrology consulted and stated since pt was symptomatic, may need to watch in ICU overnight. She was admitted to ICU with 3% saline at 25 ml/hr. Her Na is 121 this AM. She states that she is a little anxious about her bipolar meds being held. We spoke with Dr Deatra Robinson about her medications and he is going to start her some of her home medications. He states she is ready for tele floor today and will be transferred out of CCU.       Farrel Gobble, NP-C

## 2022-03-28 NOTE — Consults (Signed)
[place]    NEPHROLOGY CONSULTATION NOTE       MONEY MCKEITHAN 46 y.o. female [employer]   Date of Service: 03/28/2022    Date of Admission:  03/27/2022   PCP: Chauncy Lean, FNP-C Code Status:Full Code       Reason for Consultation:  symptomatic hyponatremia    HPI:   46 year old female lying comfortably in bed asking for her anti depression and other psychiatric medications to be resumed.  Patient denies chest pain.  Discussed with her fluid restriction.      ROS:   Systematic review of 12 organ systems was negative except what mentioned in in the HPI.          Current medications   acetaminophen (TYLENOL) tablet, 650 mg, Oral, Q4H PRN  atorvastatin (LIPITOR) tablet, 40 mg, Oral, QPM  busPIRone (BUSPAR) tablet, 15 mg, Oral, 3x/day  dextrose 50% (0.5 g/mL) injection - syringe, 25 g, Intravenous, Q15 Min PRN   Or  dextrose 50% (0.5 g/mL) injection - syringe, 12.5 g, Intravenous, Q15 Min PRN   Or  glucagon (GLUCAGEN DIAGNOSTIC KIT) injection 1 mg, 1 mg, Subcutaneous, Q15 Min PRN   Or  glucagon (GLUCAGEN DIAGNOSTIC KIT) injection 1 mg, 1 mg, IntraMUSCULAR, Q15 Min PRN  diclofenac (VOLTAREN) delayed release tablet, 50 mg, Oral, 3x/day  hydrALAZINE (APRESOLINE) injection 10 mg, 10 mg, Intravenous, Q6H PRN  hydrOXYzine pamoate (VISTARIL) capsule, 50 mg, Oral, Q6H PRN  hydrOXYzine pamoate (VISTARIL) capsule, 50 mg, Oral, 4x/day  lisinopriL (PRINIVIL) tablet 30 mg, 30 mg, Oral, Daily  LORazepam (ATIVAN) tablet, 1 mg, Oral, 2x/day PRN  meclizine (ANTIVERT) tablet, 12.5 mg, Oral, Q8H PRN  metoprolol succinate (TOPROL-XL) 24 hr extended release tablet, 25 mg, Oral, Daily  mirtazapine (REMERON) tablet, 45 mg, Oral, NIGHTLY  NS flush syringe, 3 mL, Intracatheter, Q8HRS  NS flush syringe, 3 mL, Intracatheter, Q1H PRN  ondansetron (ZOFRAN) 2 mg/mL injection, 4 mg, Intravenous, Q6H PRN  OXcarbazepine (TRILEPTAL) tablet, 600 mg, Oral, 2x/day  pantoprazole (PROTONIX) delayed release tablet, 40 mg, Oral,  Daily  QUEtiapine (SEROQUEL) tablet, 400 mg, Oral, NIGHTLY  QUEtiapine (SEROQUEL) tablet, 50 mg, Oral, 2x/day  SSIP insulin R human (HUMULIN R) 100 units/mL injection, 0-12 Units, Subcutaneous, 4x/day PRN            Physical:  Filed Vitals:    03/28/22 0100 03/28/22 0115 03/28/22 0130 03/28/22 0300   BP: 126/75  112/67    Pulse: 70 72 73    Resp: _0 Temp:       SpO2: 96% 96% 93%         Intake/Output Summary (Last 24 hours) at 03/28/2022 1438  Last data filed at 03/27/2022 1843  Gross per 24 hour   Intake 1350 ml   Output --   Net 1350 ml        Patient is anxious.  Awake.  Cardiovascular system: Regular rate and rhythm no murmurs rubs or gallops. No chest wall tenderness  Lungs: Clear to auscultation bilaterally no wheezing no rhonchi.  Abdomen soft nontender nondistended.  Extremities no clubbing cyanosis or edema   Neuro exam: EOMI, normal speech    Labs:  CBC:     6.5 (07/29 5643) \   10.2* (07/29 3295) /   276 (07/29 1884)      / 30.0* (07/29 1660) \          BMP:   124* (07/29 1112) 94* (07/29 1112) 11 (  07/29 1112)    /     155* (07/29 1112)   4.0 (07/29 1112) 22 (07/29 1112) 0.96 (07/29 1112) \                 Diagnostic studies:  Imaging:   ECG 12 LEAD  Sinus tachycardia  Possible Left atrial enlargement  Indeterminate axis  Low voltage QRS  Septal infarct , age undetermined  Abnormal ECG  No previous ECGs available  Confirmed by Johnn Hai ([id number]) on 03/28/2022 2:13:08 PM      Assessments:  Active Hospital Problems   (*Primary Problem)    Diagnosis    *Hyponatremia    Hypomagnesemia    Elevated CPK    Uncontrolled hypertension    Acute hyponatremia    Sleep apnea       Plan:     Hyponatremia  -sodium improved 124  -goal by tomorrow morning around 130  -resume her psychiatric medication  -fluid restriction  -no need for 3% saline   -one sodium tablets today  -urine sodium and urine osmolarity  -patient reports family history of lung cancer chest x-ray reviewed  -TSH with a normal  -hold HCTZ     If  sodium this evening above 130 we will start patient on D5 water.    MY ORDERS LAST 24 (24h ago, onward)       Start     Ordered    03/28/22 5456  BASIC METABOLIC PANEL  Y5W X 24 HOURS (labs only)       03/28/22 1251    03/28/22 3893  BASIC METABOLIC PANEL  T3S x 24 HOURS (labs only),   Status:  Canceled       03/28/22 1224    03/28/22 1400  sodium chloride tablet  ONCE         03/28/22 1251    03/28/22 1200  3% hypertonic sodium chloride infusion  CONTINUOUS,   Status:  Discontinued         03/28/22 1014    03/28/22 1200  OXcarbazepine (TRILEPTAL) tablet  2 TIMES DAILY         03/28/22 1107    03/28/22 1200  hydrOXYzine pamoate (VISTARIL) capsule  4 TIMES DAILY         03/28/22 1107    03/27/22 1815  DIET CARDIAC (2G NA, LOWFAT, LOW CHOL) Do you want to initiate MNT Protocol? Yes; Calorie amount: CC 1800; Restrict fluids to: 1000 ML  ALL MEALS         03/27/22 1800    03/27/22 1815  SODIUM, RANDOM URINE  ONE TIME         03/27/22 1804    03/27/22 1815  OSMOLALITY, RANDOM URINE  ONE TIME         03/27/22 1804                                Beather Arbour, MD, FASN, 03/28/2022, 14:38

## 2022-03-28 NOTE — Progress Notes (Signed)
[facility]    HOSPITALIST PROGRESS NOTE    Miranda Young  Date of service: 03/28/2022  Date of Admission:  03/27/2022  Hospital Day:  LOS: 1 day     Subjective:   Pt was admitted to CCU last pm for hyponatremia with a sodium of 116. 3% saline started. Nephrology consulted. Sodium is 121 today. She is voicing concern about some of her psychiatric home medications. We have deferred that to nephrology. Dr Liliane Shi states pt may be transferred to tele floor today.     Vital Signs:  Filed Vitals:    03/28/22 0100 03/28/22 0115 03/28/22 0130 03/28/22 0300   BP: 126/75  112/67    Pulse: 70 72 73    Resp: '19 17 16 18   '$ Temp:       SpO2: 96% 96% 93%             Physical Exam  HENT:      Mouth/Throat:      Mouth: Mucous membranes are moist.   Eyes:      Pupils: Pupils are equal, round, and reactive to light.   Cardiovascular:      Rate and Rhythm: Normal rate and regular rhythm.   Pulmonary:      Effort: Pulmonary effort is normal.      Breath sounds: Normal breath sounds.   Abdominal:      General: Bowel sounds are normal.      Palpations: Abdomen is soft.   Musculoskeletal:      Cervical back: Neck supple.   Skin:     General: Skin is warm and dry.      Capillary Refill: Capillary refill takes less than 2 seconds.   Neurological:      Mental Status: She is alert and oriented to person, place, and time.          Intake & Output:    Intake/Output Summary (Last 24 hours) at 03/28/2022 1224  Last data filed at 03/27/2022 1843  Gross per 24 hour   Intake 1350 ml   Output --   Net 1350 ml     I/O current shift:  No intake/output data recorded.  Emesis:    BM:       Heme:      3% hypertonic sodium chloride infusion, 25 mL/hr, Intravenous, Continuous  acetaminophen (TYLENOL) tablet, 650 mg, Oral, Q4H PRN  atorvastatin (LIPITOR) tablet, 40 mg, Oral, QPM  busPIRone (BUSPAR) tablet, 15 mg, Oral, 3x/day  dextrose 50% (0.5 g/mL) injection - syringe, 25 g, Intravenous, Q15 Min PRN   Or  dextrose 50% (0.5 g/mL)  injection - syringe, 12.5 g, Intravenous, Q15 Min PRN   Or  glucagon (GLUCAGEN DIAGNOSTIC KIT) injection 1 mg, 1 mg, Subcutaneous, Q15 Min PRN   Or  glucagon (GLUCAGEN DIAGNOSTIC KIT) injection 1 mg, 1 mg, IntraMUSCULAR, Q15 Min PRN  diclofenac (VOLTAREN) delayed release tablet, 50 mg, Oral, 3x/day  hydrALAZINE (APRESOLINE) injection 10 mg, 10 mg, Intravenous, Q6H PRN  hydrOXYzine pamoate (VISTARIL) capsule, 50 mg, Oral, Q6H PRN  hydrOXYzine pamoate (VISTARIL) capsule, 50 mg, Oral, 4x/day  lisinopriL (PRINIVIL) tablet 30 mg, 30 mg, Oral, Daily  LORazepam (ATIVAN) tablet, 1 mg, Oral, 2x/day PRN  metoprolol succinate (TOPROL-XL) 24 hr extended release tablet, 25 mg, Oral, Daily  mirtazapine (REMERON) tablet, 45 mg, Oral, NIGHTLY  NS flush syringe, 3 mL, Intracatheter, Q8HRS  NS flush syringe, 3 mL, Intracatheter, Q1H PRN  ondansetron (ZOFRAN) 2 mg/mL injection, 4 mg,  Intravenous, Q6H PRN  OXcarbazepine (TRILEPTAL) tablet, 600 mg, Oral, 2x/day  pantoprazole (PROTONIX) delayed release tablet, 40 mg, Oral, Daily  QUEtiapine (SEROQUEL) tablet, 400 mg, Oral, NIGHTLY  QUEtiapine (SEROQUEL) tablet, 50 mg, Oral, 2x/day  SSIP insulin R human (HUMULIN R) 100 units/mL injection, 0-12 Units, Subcutaneous, 4x/day PRN          Labs:  Recent Results (from the past 48 hour(s))   CBC WITH DIFF    Collection Time: 03/28/22  6:13 AM   Result Value    WBC 6.5    HGB 10.2 (L)    HCT 30.0 (L)    PLATELETS 276      Results for orders placed or performed during the hospital encounter of 03/27/22 (from the past 48 hour(s))   BASIC METABOLIC PANEL    Collection Time: 03/28/22 11:12 AM   Result Value    SODIUM 124 (L)    POTASSIUM 4.0    CHLORIDE 94 (L)    CO2 TOTAL 22    GLUCOSE 155 (H)    BUN 11    CREATININE 0.96      Recent Results (from the past 48 hour(s))   COMPREHENSIVE METABOLIC PANEL, NON-FASTING    Collection Time: 03/27/22 12:12 PM   Result Value    ALKALINE PHOSPHATASE 90    ALT (SGPT) 18    AST (SGOT) 19      Results for orders  placed or performed during the hospital encounter of 03/27/22 (from the past 48 hour(s))   TROPONIN-I    Collection Time: 03/27/22 12:12 PM   Result Value    TROPONIN I 3      Recent Results (from the past 48 hour(s))   PTT (PARTIAL THROMBOPLASTIN TIME)    Collection Time: 03/27/22 12:12 PM   Result Value    APTT 32.0   PT/INR    Collection Time: 03/27/22 12:12 PM   Result Value    PROTHROMBIN TIME 12.0    INR 1.03      Results for orders placed or performed during the hospital encounter of 03/27/22 (from the past 1344 hour(s))   HGA1C (HEMOGLOBIN A1C WITH EST AVG GLUCOSE)    Collection Time: 03/28/22  6:13 AM   Result Value    HEMOGLOBIN A1C 5.8      No results found for this or any previous visit (from the past 48 hour(s)).     Microbiology:  Hospital Encounter on 03/27/22 (from the past 96 hour(s))   URINE CULTURE,ROUTINE    Collection Time: 03/27/22  5:14 PM    Specimen: Urine, Clean Catch   Culture Result Status    URINE CULTURE >100,000 CFU/mL Gram Negative Rods (A) Preliminary    Narrative    CONTINUING TO HOLD         Imaging:   CT ANGIO INTRA-EXTRA CRANIAL W IV CONTRAST  Narrative: Erline Levine Reiger    RADIOLOGIST: Colletta Maryland, MD    CT ANGIO INTRA-EXTRA CRANIAL W IV CONTRAST performed on 03/27/2022 2:33 PM    CLINICAL HISTORY: FACIAL NUMBNESS.  facial numbness, headache, high bp    TECHNIQUE:  Head CT without intravenous contrast followed by CTA imaging of the head and neck from the aortic arch to the skull vertex with intravenous contrast.  3D reconstructions.    CONTRAST: 100 ml's of Omnipaque 350    COMPARISON:  None.  # of known CTs in the past 12 months:  0   # of known Cardiac Nuclear Medicine Studies  in the past 12 months:  0    FINDINGS:  Aortic Arch: Normal size and branching pattern.  No significant atherosclerotic plaque.  Brachiocephalic and Subclavians:  Unremarkable    Right Carotid:  Right CCA: Unremarkable  Right ICA: Unremarkable     Maximum stenosis (NASCET):  0 %  Right ECA:  Unremarkable    Left Carotid:  Left CCA: Unremarkable  Left ICA: Unremarkable     Maximum stenosis (NASCET):  0 %  Left ECA: Unremarkable    Vertebrals:  Codominant. Arise from the subclavians. Both vertebrals form the basilar.  Right Vertebral: Unremarkable.  Left Vertebral: Unremarkable.    The anterior communicating artery is hypoplastic which is a normal anatomic variation.   No intracranial aneurysms or large vascular malformations are identified.  Anterior cerebral arteries:  Unremarkable.  Middle cerebral arteries:  Unremarkable    Basilar artery:  Unremarkable  Posterior cerebral arteries:  Unremarkable  Other major branches of the posterior circulation:  Unremarkable    Major venous structures:  Unremarkable  Impression: 1.RIGHT CAROTID: UNREMARKABLE  2.LEFT CAROTID: UNREMARKABLE  3.VERTEBRALS: UNREMARKABLE  4.INTRACRANIAL: UNREMARKABLE  5.    One or more dose reduction techniques were used (e.g., Automated exposure control, adjustment of the mA and/or kV according to patient size, use of iterative reconstruction technique).    Radiologist location ID: [id number]  XR AP MOBILE CHEST  Narrative: Erline Levine Wittler    RADIOLOGIST: Danne Baxter, MD    XR AP MOBILE CHEST performed on 03/27/2022 12:52 PM    CLINICAL HISTORY: FEVER.  fever, dizziness    TECHNIQUE: Frontal view of the chest.    COMPARISON:  10/11/2014    FINDINGS:    The heart size is normal.   The lungs are clear.   Impression: NEGATIVE CHEST    Radiologist location ID: [id number]  CT BRAIN WO IV CONTRAST  Narrative: Erline Levine Toran    RADIOLOGIST: Colletta Maryland, MD    CT BRAIN WO IV CONTRAST performed on 03/27/2022 12:24 PM    CLINICAL HISTORY: headache.  HA, NUMBNESS, AMS, DIZZINESS    TECHNIQUE:  Head CT without intravenous contrast.    COMPARISON: None.  # of known CTs in the past 12 months:  0   # of known Cardiac Nuclear Medicine Studies in the past 12 months:  0    FINDINGS:  There is no acute intracranial hemorrhage, mass effect, or  evidence of large acute infarct.    Brain: Normal    CSF Spaces: Normal     Sinuses/Mastoids:  Clear at visualized levels     Bones: Unremarkable  Impression: NO ACUTE FINDINGS    One or more dose reduction techniques were used (e.g., Automated exposure control, adjustment of the mA and/or kV according to patient size, use of iterative reconstruction technique).    Radiologist location ID: [id number]      Assessment/ Plan:   Active Hospital Problems   (*Primary Problem)    Diagnosis    *Hyponatremia    Hypomagnesemia    Elevated CPK    Uncontrolled hypertension    Acute hyponatremia    Sleep apnea     Hyponatremia  -Improving. Nephrology on. Salt tabs ordered.    Hypomagnesemia  -Mag replaced and repeat mag pending. Replace as needed    Elevated CK  -Only mildly elevated and improved today    Uncontrolled HTN  -Cont home meds. Controlled at this time    Dizziness  -Likely vertigo. Will try antivert. Head  CT and neck CTA shows no acute finding.    Trans to tele floor.        Disposition Planning:     The Hospitalist personally evaluated and examined the patient in conjunction with the MLP and agree with the assessments, treatment plan and disposition of the patient as recorded by the [facility].   Angelina Pih, NP-C  03/28/2022  [facility] MEDICINE HOSPITALIST

## 2022-03-29 DIAGNOSIS — R42 Dizziness and giddiness: Secondary | ICD-10-CM

## 2022-03-29 LAB — BASIC METABOLIC PANEL
ANION GAP: 7 mmol/L — ABNORMAL LOW (ref 10–20)
ANION GAP: 6 mmol/L — ABNORMAL LOW (ref 10–20)
BUN/CREA RATIO: 12 (ref 6–22)
BUN/CREA RATIO: 12 (ref 6–22)
BUN: 10 mg/dL (ref 7–25)
BUN: 9 mg/dL (ref 7–25)
CALCIUM: 8.2 mg/dL — ABNORMAL LOW (ref 8.6–10.3)
CALCIUM: 8.7 mg/dL (ref 8.6–10.3)
CHLORIDE: 98 mmol/L (ref 98–107)
CHLORIDE: 97 mmol/L — ABNORMAL LOW (ref 98–107)
CO2 TOTAL: 24 mmol/L (ref 21–31)
CO2 TOTAL: 25 mmol/L (ref 21–31)
CREATININE: 0.84 mg/dL (ref 0.60–1.30)
CREATININE: 0.73 mg/dL (ref 0.60–1.30)
ESTIMATED GFR: 86 mL/min/{1.73_m2} (ref >59)
ESTIMATED GFR: 102 mL/min/{1.73_m2} (ref >59)
GLUCOSE: 110 mg/dL — ABNORMAL HIGH (ref 74–109)
GLUCOSE: 149 mg/dL — ABNORMAL HIGH (ref 74–109)
OSMOLALITY, CALCULATED: 259 mOsm/kg — ABNORMAL LOW (ref 270–290)
OSMOLALITY, CALCULATED: 259 mOsm/kg — ABNORMAL LOW (ref 270–290)
POTASSIUM: 4.3 mmol/L (ref 3.5–5.1)
POTASSIUM: 4 mmol/L (ref 3.5–5.1)
SODIUM: 129 mmol/L — ABNORMAL LOW (ref 136–145)
SODIUM: 128 mmol/L — ABNORMAL LOW (ref 136–145)

## 2022-03-29 LAB — POC BLOOD GLUCOSE (RESULTS)
GLUCOSE, POC: 102 mg/dl (ref 50–500)
GLUCOSE, POC: 97 mg/dl (ref 50–500)

## 2022-03-29 MED ORDER — SODIUM CHLORIDE 1,000 MG SOLUBLE TABLET
1.0000 g | ORAL_TABLET | Status: AC
Start: 2022-03-29 — End: 2022-03-29
  Administered 2022-03-29: 1 g via ORAL
  Filled 2022-03-29: qty 1

## 2022-03-29 MED ORDER — IBUPROFEN 800 MG TABLET
800.0000 mg | ORAL_TABLET | Freq: Once | ORAL | 0 refills | Status: AC
Start: 2022-03-29 — End: 2022-03-29

## 2022-03-29 MED ORDER — MECLIZINE 12.5 MG TABLET
12.5000 mg | ORAL_TABLET | Freq: Three times a day (TID) | ORAL | 0 refills | Status: AC | PRN
Start: 2022-03-29 — End: 2022-04-12

## 2022-03-29 NOTE — Discharge Summary (Signed)
[facility]  DISCHARGE SUMMARY    PATIENT NAME:  Miranda Young, Miranda Young  MRN:  [id number]  DOB:  1976/06/22    ENCOUNTER DATE:  03/27/2022  INPATIENT ADMISSION DATE: 03/27/2022  DISCHARGE DATE:  03/29/2022    ATTENDING PHYSICIAN: Ebbie Ridge, DO  SERVICE: PRN HOSPITALIST 1  PRIMARY CARE PHYSICIAN: Delice Bison, FNP-C       No lay caregiver identified.    PRIMARY DISCHARGE DIAGNOSIS: Hyponatremia  Active Hospital Problems    Diagnosis Date Noted    Principal Problem: Hyponatremia [E87.1] 03/27/2022    Uncontrolled hypertension [I10] 03/27/2022    Sleep apnea [G47.30] 03/27/2022      Resolved Hospital Problems    Diagnosis     Hypomagnesemia [E83.42]     Elevated CPK [R74.8]     Acute hyponatremia [E87.1]      There are no active non-hospital problems to display for this patient.          Current Discharge Medication List        START taking these medications.        Details   meclizine 12.5 mg Tablet  Commonly known as: ANTIVERT   12.5 mg, Oral, EVERY 8 HOURS PRN  Qty: 42 Tablet  Refills: 0            CONTINUE these medications which have CHANGED during your visit.        Details   * Ibuprofen 200 mg Tablet  Commonly known as: MOTRIN  What changed: Another medication with the same name was added. Make sure you understand how and when to take each.   800 mg, Oral, 3 TIMES DAILY PRN  Refills: 0     * Ibuprofen 800 mg Tablet  Commonly known as: MOTRIN  What changed: You were already taking a medication with the same name, and this prescription was added. Make sure you understand how and when to take each.   800 mg, Oral, ONCE  Qty: 1 Tablet  Refills: 0           * This list has 2 medication(s) that are the same as other medications prescribed for you. Read the directions carefully, and ask your doctor or other care provider to review them with you.                CONTINUE these medications - NO CHANGES were made during your visit.        Details   atorvastatin 40 mg Tablet  Commonly known as: LIPITOR   40 mg,  Oral, EVERY EVENING  Refills: 0     busPIRone 15 mg Tablet  Commonly known as: BUSPAR   15 mg, Oral, 3 TIMES DAILY  Refills: 0     diclofenac sodium 75 mg Tablet, Delayed Release (E.C.)  Commonly known as: VOLTAREN   75 mg, Oral, 3 TIMES DAILY, Dose verified with patient   Refills: 0     hydrOXYzine HCL 50 mg Tablet  Commonly known as: ATARAX   50 mg, Oral, 4 TIMES DAILY  Refills: 0     Lisinopril 30 mg Tablet  Commonly known as: PRINIVIL   30 mg, Oral, DAILY  Refills: 0     LORazepam 1 mg Tablet  Commonly known as: ATIVAN   1 mg, Oral, 2 TIMES DAILY PRN  Refills: 0     metFORMIN 500 mg Tablet  Commonly known as: GLUCOPHAGE   500 mg, Oral, EVERY MORNING WITH BREAKFAST  Refills: 0  metoprolol succinate 25 mg Tablet Sustained Release 24 hr  Commonly known as: TOPROL-XL   25 mg, Oral, DAILY  Refills: 0     metoprolol tartrate 25 mg Tablet  Commonly known as: LOPRESSOR   25 mg, Oral, DAILY  Refills: 0     Mirtazapine 45 mg Tablet  Commonly known as: REMERON   45 mg, Oral, NIGHTLY  Refills: 0     omeprazole 20 mg Capsule, Delayed Release(E.C.)  Commonly known as: PRILOSEC   20 mg, Oral, DAILY  Refills: 0     ondansetron 4 mg Tablet  Commonly known as: ZOFRAN   4 mg, Oral, DAILY PRN  Refills: 0     OXcarbazepine 300 mg Tablet  Commonly known as: TRILEPTAL   600 mg, Oral, 2 TIMES DAILY  Refills: 0     * QUEtiapine 400 mg Tablet  Commonly known as: SEROQUEL   400 mg, Oral, NIGHTLY  Refills: 0     * QUEtiapine 50 mg Tablet  Commonly known as: SEROQUEL   50 mg, Oral, 4 TIMES DAILY, Verified dose with patient   Refills: 0     * QUEtiapine 50 mg Tablet  Commonly known as: SEROQUEL   50 mg, Oral, 2 TIMES DAILY PRN, In addition to scheduled Seroquel  Refills: 0           * This list has 3 medication(s) that are the same as other medications prescribed for you. Read the directions carefully, and ask your doctor or other care provider to review them with you.                STOP taking these medications.      hydroCHLOROthiazide  25 mg Tablet  Commonly known as: HYDRODIURIL            Discharge med list refreshed?  YES     Allergies   Allergen Reactions    Amoxicillin Nausea/ Vomiting    Iron Nausea/ Vomiting     HOSPITAL PROCEDURE(S):   No orders of the defined types were placed in this encounter.      REASON FOR HOSPITALIZATION AND HOSPITAL COURSE   BRIEF HPI:  This is a 46 y.o., female with past medical history of bipolar disorder and hypertension presented to the hospital after having dizziness with headache and some nausea.  Patient was found to have significant hyponatremia at 1:18 a.m.Marland Kitchen  She was admitted to the ICU and started on 3% saline with fluid restriction.  Over the next couple days her sodium slowly improved at an appropriate rate and is now up to 129 on the day of discharge.  Her hydrochlorothiazide was discontinued and she is on multiple psychiatric medications which can cause some SIADH.  At this point in time, I do feel that discontinuing the HCTZ with follow-up on an outpatient basis and repeat BMP would be the appropriate course of action.  I do not want to adjust her psychiatric medications unless absolutely necessary.  Patient also be discharged on meclizine to help with some of her dizziness.  Hopefully this will completely resolve as the hyponatremia improves.    Physical exam:  BP (!) 144/84   Pulse 86   Temp 36.8 C (98.3 F)   Resp 16   Ht 1.651 m (5\' 5" )   Wt 89.4 kg (197 lb 1.5 oz)   SpO2 96%   BMI 32.80 kg/m       Gen: This is a 46 y.o. Year-old female  who is  awake alert and oriented x3 in no acute distress  CV:  Regular rate and rhythm without murmurs rubs or gallops.  2+ pedal and radial pulses. No clubbing, cyanosis, or edema.  RESP:  Clear to auscultation bilaterally without wheezes rales or rhonchi.  Respirations are nonlabored.  GI:  Abdomen is soft, nontender, nondistended.  Bowel sounds normoactive.  GU:  No Foley is present  MSK:  Full range of motion of upper and lower extremities  NEURO:   Cranial nerves 2-12 grossly intact.  No focal deficits as tested.  SKIN: Warm, dry, intact, without lesions, rashes, or ulcerations       CONDITION ON DISCHARGE:  A. Ambulation: Full ambulation  B. Self-care Ability: Complete  C. Cognitive Status Alert and Oriented x 3  D. Code status at discharge:       LINES/DRAINS/WOUNDS AT DISCHARGE:   Patient Lines/Drains/Airways Status       Active Line / Dialysis Catheter / Dialysis Graft / Drain / Airway / Wound       None                    DISCHARGE DISPOSITION:  Home discharge  DISCHARGE INSTRUCTIONS:  Post-Discharge Follow Up Appointments       Follow up with Delice Bison, FNP-C in 1 week(s)    Phone: [phone]    Where: [address] [place] [address]             BASIC METABOLIC PANEL     Release to patient Automated           Ebbie Ridge, DO    Copies sent to Care Team         Relationship Specialty Notifications Start End    Delice Bison, FNP-C PCP - General NURSE PRACTITIONER  12/18/21     Phone: [phone] Fax: [phone]         [address] [place] [address]            Referring providers can utilize [email or web address] to access their referred [facility] patient's information.

## 2022-03-29 NOTE — Nurses Notes (Addendum)
Patient discharged home with family.  AVS reviewed with patient/care giver.  A written copy of the AVS and discharge instructions was given to the patient/care giver.  Questions sufficiently answered as needed.  Patient/care giver encouraged to follow up with PCP as indicated.  In the event of an emergency, patient/care giver instructed to call 911 or go to the nearest emergency room. Leaves with all belongings. Taken by wheelchair to private car with family member to drive home.

## 2022-03-30 LAB — URINE CULTURE,ROUTINE: URINE CULTURE: >100000 — AB

## 2022-04-01 ENCOUNTER — Other Ambulatory Visit: Payer: Self-pay

## 2022-04-01 ENCOUNTER — Ambulatory Visit: Payer: Medicaid Other | Attending: Internal Medicine

## 2022-04-01 DIAGNOSIS — E871 Hypo-osmolality and hyponatremia: Secondary | ICD-10-CM | POA: Insufficient documentation

## 2022-04-01 LAB — ADULT ROUTINE BLOOD CULTURE, SET OF 2 BOTTLES (BACTERIA AND YEAST)
BLOOD CULTURE, ROUTINE: NO GROWTH
BLOOD CULTURE, ROUTINE: NO GROWTH

## 2022-04-01 LAB — BASIC METABOLIC PANEL
ANION GAP: 9 mmol/L — ABNORMAL LOW (ref 10–20)
BUN/CREA RATIO: 9 (ref 6–22)
BUN: 8 mg/dL (ref 7–25)
CALCIUM: 8.8 mg/dL (ref 8.6–10.3)
CHLORIDE: 95 mmol/L — ABNORMAL LOW (ref 98–107)
CO2 TOTAL: 23 mmol/L (ref 21–31)
CREATININE: 0.88 mg/dL (ref 0.60–1.30)
ESTIMATED GFR: 82 mL/min/{1.73_m2} (ref >59)
GLUCOSE: 161 mg/dL — ABNORMAL HIGH (ref 74–109)
OSMOLALITY, CALCULATED: 257 mOsm/kg — ABNORMAL LOW (ref 270–290)
POTASSIUM: 4.3 mmol/L (ref 3.5–5.1)
SODIUM: 127 mmol/L — ABNORMAL LOW (ref 136–145)

## 2022-04-14 ENCOUNTER — Ambulatory Visit (INDEPENDENT_AMBULATORY_CARE_PROVIDER_SITE_OTHER): Payer: Medicaid Other | Admitting: NURSE PRACTITIONER

## 2022-04-14 ENCOUNTER — Other Ambulatory Visit: Payer: Self-pay

## 2022-04-14 VITALS — BP 166/98 | Ht 65.0 in | Wt 197.0 lb

## 2022-04-14 DIAGNOSIS — Z029 Encounter for administrative examinations, unspecified: Secondary | ICD-10-CM

## 2022-04-14 NOTE — Progress Notes (Signed)
The patient did not appear for their appointment/or scheduled appointment was cancelled.  This office visit opened in error.

## 2022-05-18 ENCOUNTER — Ambulatory Visit (INDEPENDENT_AMBULATORY_CARE_PROVIDER_SITE_OTHER): Payer: Self-pay | Admitting: NURSE PRACTITIONER

## 2022-05-26 ENCOUNTER — Other Ambulatory Visit: Payer: Self-pay

## 2022-05-26 ENCOUNTER — Encounter (INDEPENDENT_AMBULATORY_CARE_PROVIDER_SITE_OTHER): Payer: Self-pay | Admitting: NURSE PRACTITIONER

## 2022-05-26 ENCOUNTER — Ambulatory Visit (INDEPENDENT_AMBULATORY_CARE_PROVIDER_SITE_OTHER): Payer: Medicaid Other | Admitting: NURSE PRACTITIONER

## 2022-05-26 VITALS — BP 136/84 | Ht 65.0 in | Wt 195.0 lb

## 2022-05-26 DIAGNOSIS — G4733 Obstructive sleep apnea (adult) (pediatric): Secondary | ICD-10-CM

## 2022-05-26 DIAGNOSIS — R42 Dizziness and giddiness: Secondary | ICD-10-CM

## 2022-05-26 DIAGNOSIS — J342 Deviated nasal septum: Secondary | ICD-10-CM

## 2022-05-26 DIAGNOSIS — F319 Bipolar disorder, unspecified: Secondary | ICD-10-CM | POA: Insufficient documentation

## 2022-05-26 NOTE — Progress Notes (Unsigned)
[facility]  [address]  [place] [address]  Phone: [phone]  Fax: [phone]      Encounter Date: 05/26/2022    Patient ID: Miranda Young  MRN: [id number]    DOB: 23-Dec-1975  Age: 46 y.o. female         Referring Provider:    No referring provider defined for this encounter.    Reason for Visit:   Chief Complaint   Patient presents with    Ear Problem(s)     Pt complains of dizziness that caused her to see "six of everything"    Sleep Apnea     Pt complains of snoring and witnessed apneas. Patient states she has a dx of osa, has not had a psg in about 10 years        History of Present Illness:  Miranda Young is a 46 y.o. female complains of dizziness upon standing and double vision that began 2 months ago.  Her BP was elevated and started on new medications and BP is now controlled and dizziness and double vision without trigger.  Will often have a headache with these episodes.  She has also had a CT brain and CTA brain.      Patient History:  Patient Active Problem List   Diagnosis    Hyponatremia    Uncontrolled hypertension    Sleep apnea    Anxiety and depression    Bipolar 1 disorder (CMS HCC)     Current Outpatient Medications   Medication Sig    amLODIPine (NORVASC) 10 mg Oral Tablet Take 1 Tablet (10 mg total) by mouth Once a day    atorvastatin (LIPITOR) 40 mg Oral Tablet Take 1 Tablet (40 mg total) by mouth Every evening    busPIRone (BUSPAR) 15 mg Oral Tablet Take 1 Tablet (15 mg total) by mouth Three times a day    chlorhexidine gluconate (PERIDEX) 0.12 % Mucous Membrane Mouthwash RINSE 15 ML'S TWICE DAILY AFTER BREAKFAST/BEFORE BEDTIME FOLLOWING BRUSHING AND FLOSSING    diclofenac sodium (VOLTAREN) 75 mg Oral Tablet, Delayed Release (E.C.) Take 1 Tablet (75 mg total) by mouth Three times a day Dose verified with patient    escitalopram oxalate (LEXAPRO) 5 mg Oral Tablet     HYDROcodone-acetaminophen (NORCO) 5-325 mg Oral Tablet Take 1 Tablet by mouth Every 6 hours as needed for  Pain    hydrOXYzine HCL (ATARAX) 50 mg Oral Tablet Take 1 Tablet (50 mg total) by mouth Four times a day    Ibuprofen (MOTRIN) 200 mg Oral Tablet Take 4 Tablets (800 mg total) by mouth Three times a day as needed    Lisinopril (PRINIVIL) 30 mg Oral Tablet Take 1 Tablet (30 mg total) by mouth Once a day    LORazepam (ATIVAN) 1 mg Oral Tablet Take 1 Tablet (1 mg total) by mouth Twice per day as needed for Anxiety    metoprolol succinate (TOPROL-XL) 25 mg Oral Tablet Sustained Release 24 hr Take 1 Tablet (25 mg total) by mouth Once a day    Mirtazapine (REMERON) 45 mg Oral Tablet Take 1 Tablet (45 mg total) by mouth Every night    omeprazole (PRILOSEC) 20 mg Oral Capsule, Delayed Release(E.C.) Take 1 Capsule (20 mg total) by mouth Once a day    ondansetron (ZOFRAN) 4 mg Oral Tablet Take 1 Tablet (4 mg total) by mouth Once per day as needed    OXcarbazepine (TRILEPTAL) 300 mg Oral Tablet Take 2 Tablets (600 mg  total) by mouth Twice daily    penicillin V potassium (VEETID) 500 mg Oral Tablet Take 1 Tablet (500 mg total) by mouth Four times a day (Patient not taking: Reported on 05/26/2022)    QUEtiapine (SEROQUEL) 400 mg Oral Tablet Take 1 Tablet (400 mg total) by mouth Every night    QUEtiapine (SEROQUEL) 50 mg Oral Tablet Take 1 Tablet (50 mg total) by mouth Four times a day Verified dose with patient    QUEtiapine (SEROQUEL) 50 mg Oral Tablet Take 1 Tablet (50 mg total) by mouth Twice per day as needed In addition to scheduled Seroquel     Allergies   Allergen Reactions    Amoxicillin Nausea/ Vomiting    Iron Nausea/ Vomiting     Past Medical History:   Diagnosis Date    Anxiety     Bipolar disorder, unspecified (CMS HCC)     Constipation     Depression     Diabetes mellitus, type 2 (CMS HCC)     Esophageal reflux     High triglycerides     HTN (hypertension)     Hypercholesterolemia     Lumbar back pain     2 herniated discs    Sleep apnea     has CPAP      Past Surgical History:   Procedure Laterality Date    HX  APPENDECTOMY      HX HYSTERECTOMY      HX TUBAL LIGATION        Family Medical History:       Problem Relation (Age of Onset)    Breast Cancer Maternal Aunt (23), Paternal Grandmother    Coronary Artery Disease Other    Endometrial cancer Other    Lung Cancer Other    Mental illness Other    No Known Problems Mother, Father, Sister, Brother, Maternal Uncle, Paternal Aunt, Paternal Uncle, Maternal Grandmother, Maternal Grandfather, Paternal Grandfather, Daughter, Son            Social History     Tobacco Use    Smoking status: Former     Types: Cigarettes    Smokeless tobacco: Never   Substance Use Topics    Alcohol use: Not Currently    Drug use: Not Currently       Review of Systems   Constitutional:  Positive for fatigue.   HENT: Negative.     Eyes:  Positive for visual disturbance.   Respiratory:  Positive for apnea.    Gastrointestinal: Negative.    Allergic/Immunologic: Negative.    Neurological:  Positive for dizziness, light-headedness and headaches.   Hematological: Negative.    Psychiatric/Behavioral: Negative.          Vitals:    05/26/22 0833   BP: 136/84   Weight: 88.5 kg (195 lb)   Height: 1.651 m (5\' 5" )   BMI: 32.52      ENT Physical Exam  Constitutional  Appearance: patient appears well-developed, well-nourished and well-groomed,  Communication/Voice: communication appropriate for developmental age; vocal quality normal;  Head and Face  Appearance: head appears normal, face appears normal and face appears atraumatic;  Palpation: facial palpation normal;  Salivary: glands normal;  Ear  Hearing: intact;  Auricles: right auricle normal; left auricle normal;  External Mastoids: right external mastoid normal; left external mastoid normal;  Ear Canals: right ear canal normal; left ear canal normal;  Tympanic Membranes: right tympanic membrane normal; left tympanic membrane normal;  Nose  External Nose: nares patent bilaterally; external  nose normal;  Internal Nose: nasal mucosa normal; nasal septal  deviation present; bilateral inferior turbinates normal;  Oral Cavity/Oropharynx  Lips: normal;  Teeth: normal;  Gums: gingiva normal;  Tongue: normal;  Oral mucosa: normal;  Hard palate: normal;  Tonsils: bilateral tonsils 2+,  Neck  Neck: neck normal; neck palpation normal;  Thyroid: thyroid normal;  Respiratory  Inspection: breathing unlabored; normal breathing rate;  Lymphatic  Palpation: no cervical adenopathy noted;  Neurovestibular  Mental Status: alert and oriented;  Psychiatric: mood normal; affect is appropriate;  Cranial Nerves: cranial nerves intact;       Assessment:  ENCOUNTER DIAGNOSES     ICD-10-CM   1. Dizziness  R42   2. OSA (obstructive sleep apnea)  G47.33   3. Nasal septal deviation  J34.2       Plan:  Medical records reviewed on 05/26/2022.  ***  Orders Placed This Encounter    [id number] - LARYNGOSCOPY, FLEXIBLE DIAGNOSTIC (AMB ONLY)    AMB PRN REFERRAL EXTERNAL AUDIOLOGIST    Referral to External Provider (AMB)     No follow-ups on file.    Emiliano Dyer, FNP-BC  05/26/2022, 08:43

## 2022-05-26 NOTE — Procedures (Unsigned)
ENT, [facility]  [address]  [place] [address]    Procedure Note    Name: Miranda Young MRN:  [id number]   Date: 05/26/2022 Age: 46 y.o.  DOB:   Jul 28, 1976       [address] - LARYNGOSCOPY, FLEXIBLE DIAGNOSTIC (AMB ONLY)    Performed by: Elnora Morrison, FNP-BC  Authorized by: Elnora Morrison, FNP-BC    Time Out:     Immediately before the procedure, a time out was called:  Yes    Patient verified:  Yes    Procedure Verified:  Yes    Site Verified:  Yes  Documentation:      Indications for procedure: Unable to perform indirect laryngoscopy secondary to gag reflex, Unable to fully visualize laryngeal anatomy on indirect laryngoscopy, and Obstructive sleep apnea / Snoring    Anesthesia: Oxymetazoline nasal spray    Description: The flexible endoscope was gently introduced into the nostril and passed along the floor of the nose to the nasopharynx. Adenoid was minimal and eustachian tubes normal. The retropalatal airway was patent.    The endoscope was passed to the oropharynx. Base of tongue displayed normal lingual tonsils, patent valelulla, and sharply defined upright epiglottis. Retrolingual airway was patent.    The larynx displayed normal true vocal cords with good mobility. False cords were normal. Arytenoid mucosa was pink with no edema.     The piriform recesses were symmetric without secretion. The hypopharynx was symmetric without lesion.    Findings: Symmetrical true vocal fold motion, mild left NSD otherwise no significant airway obstruction    The patient tolerated the procedure well.                 Elnora Morrison, FNP-BC

## 2022-05-27 ENCOUNTER — Ambulatory Visit (INDEPENDENT_AMBULATORY_CARE_PROVIDER_SITE_OTHER): Payer: Self-pay | Admitting: NURSE PRACTITIONER

## 2022-07-07 ENCOUNTER — Encounter (INDEPENDENT_AMBULATORY_CARE_PROVIDER_SITE_OTHER): Payer: Self-pay | Admitting: NURSE PRACTITIONER

## 2022-09-10 ENCOUNTER — Other Ambulatory Visit (HOSPITAL_COMMUNITY): Payer: Self-pay | Admitting: NURSE PRACTITIONER

## 2022-09-10 DIAGNOSIS — Z1231 Encounter for screening mammogram for malignant neoplasm of breast: Secondary | ICD-10-CM

## 2022-10-01 ENCOUNTER — Other Ambulatory Visit: Payer: Self-pay

## 2022-10-01 ENCOUNTER — Ambulatory Visit: Payer: Medicaid Other | Attending: Cardiovascular Disease

## 2022-10-01 DIAGNOSIS — E785 Hyperlipidemia, unspecified: Secondary | ICD-10-CM | POA: Insufficient documentation

## 2022-10-01 DIAGNOSIS — F419 Anxiety disorder, unspecified: Secondary | ICD-10-CM | POA: Insufficient documentation

## 2022-10-01 DIAGNOSIS — F458 Other somatoform disorders: Secondary | ICD-10-CM | POA: Insufficient documentation

## 2022-10-01 LAB — COMPREHENSIVE METABOLIC PANEL, NON-FASTING
ALBUMIN/GLOBULIN RATIO: 1.7 — ABNORMAL HIGH (ref 0.8–1.4)
ALBUMIN: 4.6 g/dL (ref 3.5–5.7)
ALKALINE PHOSPHATASE: 115 U/L — ABNORMAL HIGH (ref 34–104)
ALT (SGPT): 17 U/L (ref 7–52)
ANION GAP: 8 mmol/L (ref 4–13)
AST (SGOT): 14 U/L (ref 13–39)
BILIRUBIN TOTAL: 0.3 mg/dL (ref 0.3–1.2)
BUN/CREA RATIO: 11 (ref 6–22)
BUN: 10 mg/dL (ref 7–25)
CALCIUM, CORRECTED: 8.6 mg/dL — ABNORMAL LOW (ref 8.9–10.8)
CALCIUM: 9.1 mg/dL (ref 8.6–10.3)
CHLORIDE: 96 mmol/L — ABNORMAL LOW (ref 98–107)
CO2 TOTAL: 26 mmol/L (ref 21–31)
CREATININE: 0.91 mg/dL (ref 0.60–1.30)
ESTIMATED GFR: 78 mL/min/{1.73_m2} (ref >59)
GLOBULIN: 2.7 — ABNORMAL LOW (ref 2.9–5.4)
GLUCOSE: 122 mg/dL — ABNORMAL HIGH (ref 74–109)
OSMOLALITY, CALCULATED: 261 mOsm/kg — ABNORMAL LOW (ref 270–290)
POTASSIUM: 4.7 mmol/L (ref 3.5–5.1)
PROTEIN TOTAL: 7.3 g/dL (ref 6.4–8.9)
SODIUM: 130 mmol/L — ABNORMAL LOW (ref 136–145)

## 2022-10-01 LAB — CBC WITH DIFF
BASOPHIL #: 0 10*3/uL (ref 0.00–0.10)
BASOPHIL %: 1 % (ref 0–1)
EOSINOPHIL #: 0.1 10*3/uL (ref 0.00–0.50)
EOSINOPHIL %: 1 %
HCT: 33.3 % (ref 31.2–41.9)
HGB: 10.9 g/dL (ref 10.9–14.3)
LYMPHOCYTE #: 1.5 10*3/uL (ref 1.00–3.00)
LYMPHOCYTE %: 36 % (ref 16–44)
MCH: 24.5 pg — ABNORMAL LOW (ref 24.7–32.8)
MCHC: 32.8 g/dL (ref 32.3–35.6)
MCV: 74.6 fL — ABNORMAL LOW (ref 75.5–95.3)
MONOCYTE #: 0.2 10*3/uL — ABNORMAL LOW (ref 0.30–1.00)
MONOCYTE %: 6 % (ref 5–13)
MPV: 6.9 fL — ABNORMAL LOW (ref 7.9–10.8)
NEUTROPHIL #: 2.3 10*3/uL (ref 1.85–7.80)
NEUTROPHIL %: 55 % (ref 43–77)
PLATELETS: 308 10*3/uL (ref 140–440)
RBC: 4.47 10*6/uL (ref 3.63–4.92)
RDW: 14.4 % (ref 12.3–17.7)
WBC: 4.1 10*3/uL (ref 3.8–11.8)

## 2022-10-01 LAB — LIPID PANEL
CHOL/HDL RATIO: 4.3
CHOLESTEROL: 168 mg/dL (ref <200)
HDL CHOL: 39 mg/dL (ref 23–92)
LDL CALC: 55 mg/dL (ref 0–100)
TRIGLYCERIDES: 372 mg/dL — ABNORMAL HIGH (ref <=150)
VLDL CALC: 74 mg/dL — ABNORMAL HIGH (ref 0–50)

## 2022-10-01 LAB — THYROID STIMULATING HORMONE (SENSITIVE TSH): TSH: 2.096 u[IU]/mL (ref 0.450–5.330)

## 2022-10-01 LAB — T-UPTAKE: T-UPTAKE: 42.4 % (ref 32.0–48.4)

## 2022-10-01 LAB — HGA1C (HEMOGLOBIN A1C WITH EST AVG GLUCOSE): HEMOGLOBIN A1C: 6.6 % — ABNORMAL HIGH (ref 4.0–6.0)

## 2022-10-01 LAB — THYROXINE, FREE (FREE T4): THYROXINE (T4), FREE: 0.56 ng/dL — ABNORMAL LOW (ref 0.58–1.64)

## 2023-03-08 ENCOUNTER — Encounter (HOSPITAL_COMMUNITY): Payer: Self-pay

## 2023-03-08 ENCOUNTER — Ambulatory Visit
Admission: RE | Admit: 2023-03-08 | Discharge: 2023-03-08 | Disposition: A | Discharge status: Home / Self Care | Payer: Medicaid Other | Source: Ambulatory Visit | Attending: NURSE PRACTITIONER | Admitting: NURSE PRACTITIONER

## 2023-03-08 ENCOUNTER — Other Ambulatory Visit: Payer: Self-pay

## 2023-03-08 DIAGNOSIS — Z1231 Encounter for screening mammogram for malignant neoplasm of breast: Secondary | ICD-10-CM | POA: Insufficient documentation

## 2023-03-17 ENCOUNTER — Telehealth (INDEPENDENT_AMBULATORY_CARE_PROVIDER_SITE_OTHER): Payer: Self-pay | Admitting: NURSE PRACTITIONER

## 2023-03-17 NOTE — Telephone Encounter (Signed)
Called pt to inform of appt for NPV, referral from her PCP Hector Brunswick for iron deficiency.  Appt 04/23/23 at 0900, clinic location given.

## 2023-04-09 ENCOUNTER — Ambulatory Visit (INDEPENDENT_AMBULATORY_CARE_PROVIDER_SITE_OTHER)
Admission: RE | Admit: 2023-04-09 | Discharge: 2023-04-09 | Disposition: A | Discharge status: Home / Self Care | Payer: Medicaid Other | Source: Ambulatory Visit | Attending: NURSE PRACTITIONER | Admitting: NURSE PRACTITIONER

## 2023-04-09 ENCOUNTER — Encounter (INDEPENDENT_AMBULATORY_CARE_PROVIDER_SITE_OTHER): Payer: Self-pay | Admitting: NURSE PRACTITIONER

## 2023-04-09 ENCOUNTER — Other Ambulatory Visit: Payer: Self-pay

## 2023-04-09 ENCOUNTER — Ambulatory Visit: Payer: Medicaid Other | Attending: NURSE PRACTITIONER | Admitting: NURSE PRACTITIONER

## 2023-04-09 VITALS — BP 121/72 | HR 86 | Temp 98.4°F | Ht 65.0 in | Wt 209.8 lb

## 2023-04-09 DIAGNOSIS — Z87891 Personal history of nicotine dependence: Secondary | ICD-10-CM

## 2023-04-09 DIAGNOSIS — D509 Iron deficiency anemia, unspecified: Secondary | ICD-10-CM

## 2023-04-09 LAB — COMPREHENSIVE METABOLIC PANEL, NON-FASTING
ALBUMIN/GLOBULIN RATIO: 1.6 — ABNORMAL HIGH (ref 0.8–1.4)
ALBUMIN: 4.2 g/dL (ref 3.5–5.7)
ALKALINE PHOSPHATASE: 113 U/L — ABNORMAL HIGH (ref 34–104)
ALT (SGPT): 26 U/L (ref 7–52)
ANION GAP: 7 mmol/L (ref 4–13)
AST (SGOT): 17 U/L (ref 13–39)
BILIRUBIN TOTAL: 0.4 mg/dL (ref 0.3–1.0)
BUN/CREA RATIO: 15 (ref 6–22)
BUN: 13 mg/dL (ref 7–25)
CALCIUM, CORRECTED: 8.7 mg/dL — ABNORMAL LOW (ref 8.9–10.8)
CALCIUM: 8.9 mg/dL (ref 8.6–10.3)
CHLORIDE: 100 mmol/L (ref 98–107)
CO2 TOTAL: 24 mmol/L (ref 21–31)
CREATININE: 0.84 mg/dL (ref 0.60–1.30)
ESTIMATED GFR: 86 mL/min/{1.73_m2} (ref >59)
GLOBULIN: 2.7 — ABNORMAL LOW (ref 2.9–5.4)
GLUCOSE: 110 mg/dL — ABNORMAL HIGH (ref 74–109)
OSMOLALITY, CALCULATED: 264 mOsm/kg — ABNORMAL LOW (ref 270–290)
POTASSIUM: 5.1 mmol/L (ref 3.5–5.1)
PROTEIN TOTAL: 6.9 g/dL (ref 6.4–8.9)
SODIUM: 131 mmol/L — ABNORMAL LOW (ref 136–145)

## 2023-04-09 LAB — CBC WITH DIFF
BASOPHIL #: 0 10*3/uL (ref 0.00–0.10)
BASOPHIL %: 1 % (ref 0–1)
EOSINOPHIL #: 0 10*3/uL (ref 0.00–0.50)
EOSINOPHIL %: 1 % (ref 1–7)
HCT: 28.1 % — ABNORMAL LOW (ref 31.2–41.9)
HGB: 9.3 g/dL — ABNORMAL LOW (ref 10.9–14.3)
LYMPHOCYTE #: 1.6 10*3/uL (ref 1.00–3.00)
LYMPHOCYTE %: 31 % (ref 16–44)
MCH: 23.2 pg — ABNORMAL LOW (ref 24.7–32.8)
MCHC: 33 g/dL (ref 32.3–35.6)
MCV: 70.1 fL — ABNORMAL LOW (ref 75.5–95.3)
MONOCYTE #: 0.3 10*3/uL (ref 0.30–1.00)
MONOCYTE %: 6 % (ref 5–13)
MPV: 7.2 fL — ABNORMAL LOW (ref 7.9–10.8)
NEUTROPHIL #: 3.1 10*3/uL (ref 1.85–7.80)
NEUTROPHIL %: 62 % (ref 43–77)
PLATELETS: 349 10*3/uL (ref 140–440)
RBC: 4.01 10*6/uL (ref 3.63–4.92)
RDW: 17.1 % (ref 12.3–17.7)
WBC: 5 10*3/uL (ref 3.8–11.8)

## 2023-04-09 LAB — IRON TRANSFERRIN AND TIBC
IRON (TRANSFERRIN) SATURATION: 5 % — ABNORMAL LOW (ref 15–50)
IRON: 26 ug/dL — ABNORMAL LOW (ref 50–212)
TOTAL IRON BINDING CAPACITY: 518 ug/dL — ABNORMAL HIGH (ref 250–450)
TRANSFERRIN: 370 mg/dL — ABNORMAL HIGH (ref 203–362)
UIBC: 492 ug/dL — ABNORMAL HIGH (ref 130–375)

## 2023-04-09 LAB — FERRITIN: FERRITIN: 3 ng/mL — ABNORMAL LOW (ref 11–336)

## 2023-04-09 NOTE — H&P (Signed)
Department of Hematology/Oncology  History and Physical    Name: Miranda Young  ZOX:[id number]  Date of Birth: Jun 05, 1976  Encounter Date: 04/09/2023    REFERRING PROVIDER:  Delice Bison, FNP-C  [address]  [place],  [place] [address]    REASON FOR OFFICE VISIT:  New patient for evaluation and management of  iron-deficiency anemia.    HISTORY OF PRESENT ILLNESS:  Miranda Young is a 47 y.o. female who presents today for iron-deficiency anemia.  She states that she has had iron-deficiency issues ever since she can remember but it has never been this severe.  She had tried oral iron but states that it made her "deathly sick" with nausea and was "unable to get out of bed". See ROS below for other complaints.      ROS:   Review of Systems   Constitutional:  Positive for fatigue and unexpected weight change (lost 12 lbs in last 2 months). Negative for appetite change.   HENT:  Negative.     Respiratory:  Positive for shortness of breath (with exertion).    Cardiovascular: Negative.  Negative for chest pain.   Gastrointestinal:  Positive for abdominal pain (straight down midline) and nausea. Negative for diarrhea.   Genitourinary: Negative.  Negative for difficulty urinating.    Musculoskeletal:  Positive for back pain (chronic).   Skin: Negative.    Neurological: Negative.  Negative for dizziness, headaches and light-headedness.        Restless legs   Hematological:  Bruises/bleeds easily.   Psychiatric/Behavioral:  Positive for depression and sleep disturbance. The patient is nervous/anxious.         History:  Past Medical History:   Diagnosis Date    Anxiety     Bipolar disorder, unspecified (CMS HCC)     Depression     Diabetes mellitus, type 2 (CMS HCC)     Esophageal reflux     High triglycerides     HTN (hypertension)     Hypercholesterolemia     Lumbar back pain     2 herniated discs    Sleep apnea     has CPAP     Past Surgical History:   Procedure Laterality Date    ESOPHAGOGASTRODUODENOSCOPY      HX  APPENDECTOMY      HX COLONOSCOPY      HX HYSTERECTOMY      HX TUBAL LIGATION       Social History     Socioeconomic History    Marital status: Divorced     Spouse name: Not on file    Number of children: Not on file    Years of education: Not on file    Highest education level: Not on file   Occupational History    Not on file   Tobacco Use    Smoking status: Former     Types: Cigarettes    Smokeless tobacco: Never   Vaping Use    Vaping status: Never Used   Substance and Sexual Activity    Alcohol use: Not Currently    Drug use: Never    Sexual activity: Not on file   Other Topics Concern    Not on file   Social History Narrative    Not on file     Social Determinants of Health     Financial Resource Strain: Not on file   Transportation Needs: Not on file   Social Connections: Not on file   Intimate Partner Violence:  Not on file   Housing Stability: Not on file       Social History     Social History Narrative    Not on file       Social History     Substance and Sexual Activity   Drug Use Never       Family Medical History:       Problem Relation (Age of Onset)    Breast Cancer Maternal Aunt (23), Paternal Grandmother    Coronary Artery Disease Other    Elevated Triglycerides Mother    Endometrial cancer Other    Heart Disease Mother, Father    Hypertension (High Blood Pressure) Mother    Lung Cancer Other    Mental illness Other    No Known Problems Sister, Brother, Maternal Uncle, Paternal Aunt, Paternal Uncle, Maternal Grandmother, Maternal Grandfather, Paternal Grandfather, Daughter, Son    Stroke Mother, Father              Current Outpatient Medications   Medication Sig    amLODIPine (NORVASC) 10 mg Oral Tablet Take 1 Tablet (10 mg total) by mouth Once a day    atorvastatin (LIPITOR) 80 mg Oral Tablet Take 1 Tablet (80 mg total) by mouth Once a day    buPROPion (WELLBUTRIN XL) 300 mg extended release 24 hr tablet Take 1 Tablet (300 mg total) by mouth Every morning    busPIRone (BUSPAR) 15 mg Oral Tablet Take  1 Tablet (15 mg total) by mouth Three times a day    diclofenac sodium (VOLTAREN) 75 mg Oral Tablet, Delayed Release (E.C.) Take 1 Tablet (75 mg total) by mouth Three times a day Dose verified with patient    ergocalciferol, vitamin D2, (DRISDOL) 1,250 mcg (50,000 unit) Oral Capsule Take 1 Capsule (50,000 Units total) by mouth Every 7 days    furosemide (LASIX) 40 mg Oral Tablet Take 1 Tablet (40 mg total) by mouth Once a day    hydrOXYzine HCL (ATARAX) 50 mg Oral Tablet Take 1 Tablet (50 mg total) by mouth Four times a day    Ibuprofen (MOTRIN) 200 mg Oral Tablet Take 4 Tablets (800 mg total) by mouth Three times a day as needed    lisinopriL (PRINIVIL) 40 mg Oral Tablet Take 1 Tablet (40 mg total) by mouth Once a day    LORazepam (ATIVAN) 1 mg Oral Tablet Take 1 Tablet (1 mg total) by mouth Twice per day as needed for Anxiety    metoprolol succinate (TOPROL-XL) 25 mg Oral Tablet Sustained Release 24 hr Take 1 Tablet (25 mg total) by mouth Once a day    Mirtazapine (REMERON) 45 mg Oral Tablet Take 1 Tablet (45 mg total) by mouth Every night    omeprazole (PRILOSEC) 40 mg Oral Capsule, Delayed Release(E.C.) Take 1 Capsule (40 mg total) by mouth Once a day    ondansetron (ZOFRAN) 4 mg Oral Tablet Take 1 Tablet (4 mg total) by mouth Once per day as needed    OXcarbazepine (TRILEPTAL) 600 mg Oral Tablet Take 1 Tablet (600 mg total) by mouth    polyethylene glycol 3350 (MIRALAX ORAL) Take by mouth    QUEtiapine (SEROQUEL) 50 mg Oral Tablet Take 1 Tablet (50 mg total) by mouth Four times a day Verified dose with patient       Allergies   Allergen Reactions    Amoxicillin Nausea/ Vomiting    Iron Nausea/ Vomiting     Iron pills never tried iv iron  PHYSICAL EXAM:  BP 121/72 (Site: Left Arm, Patient Position: Sitting, Cuff Size: Adult)   Pulse 86   Temp 36.9 C (98.4 F) (Temporal)   Ht 1.651 m (5\' 5" )   Wt 95.2 kg (209 lb 12.8 oz)   BMI 34.91 kg/m      ECOG Status: (0) Fully active, able to carry on all  predisease performance without restriction   Physical Exam  Vitals and nursing note reviewed.   Constitutional:       Appearance: Normal appearance.   HENT:      Head: Normocephalic.      Nose: Nose normal.      Mouth/Throat:      Mouth: Mucous membranes are moist.      Pharynx: Oropharynx is clear.   Eyes:      General: No scleral icterus.     Extraocular Movements: Extraocular movements intact.   Cardiovascular:      Rate and Rhythm: Normal rate and regular rhythm.      Pulses: Normal pulses.      Heart sounds: Normal heart sounds.   Pulmonary:      Effort: Pulmonary effort is normal.      Breath sounds: Normal breath sounds.   Abdominal:      General: Bowel sounds are normal.      Palpations: Abdomen is soft.   Musculoskeletal:         General: Normal range of motion.      Cervical back: Normal range of motion and neck supple.   Skin:     General: Skin is warm and dry.   Neurological:      General: No focal deficit present.      Mental Status: She is alert and oriented to person, place, and time. Mental status is at baseline.   Psychiatric:         Mood and Affect: Mood normal.         Behavior: Behavior normal.         Thought Content: Thought content normal.         Judgment: Judgment normal.       LABS:             ASSESSMENT:    ICD-10-CM    1. Iron deficiency anemia  D50.9 FERRITIN     IRON TRANSFERRIN AND TIBC     COMPREHENSIVE METABOLIC PANEL, NON-FASTING     CBC/DIFF             PLAN:   The patient will have labs today as ordered. We discussed that I will start the process for IV iron infusion. We discussed the process of obtaining the Iron infusion, which will require insurance authorization and once this process is completed we will notify her of a time and date for her to return for the infusion. She is agreeable to this plan. The patient will keep all appointments with other providers as scheduled. She will continue current medication as ordered. She will follow up in about 6 weeks or sooner if  needed.       Miranda Young was given the chance to ask questions, and these were answered to their satisfaction. The patient is welcome to call with any questions or concerns in the meantime.     On the day of the encounter, a total of  42 minutes was spent on this patient encounter including review of historical information, examination, documentation and post-visit activities.   Return in about 4 weeks (  around 05/07/2023).     Campbell Soup, APRN,FNP-BC ,04/09/2023 ,10:56     CC:  Delice Bison, FNP-C  [address] [place] [address]    Hector Brunswick D, FNP-C  [address]  [place],  [place] [address]      This note was partially generated using MModal Fluency Direct system, and there may be some incorrect words, spellings, and punctuation that were not noted in checking the note before saving.

## 2023-04-12 ENCOUNTER — Encounter (INDEPENDENT_AMBULATORY_CARE_PROVIDER_SITE_OTHER): Payer: Self-pay | Admitting: NURSE PRACTITIONER

## 2023-04-16 ENCOUNTER — Encounter (INDEPENDENT_AMBULATORY_CARE_PROVIDER_SITE_OTHER): Payer: Self-pay | Admitting: NURSE PRACTITIONER

## 2023-04-19 ENCOUNTER — Ambulatory Visit (HOSPITAL_COMMUNITY): Payer: Medicaid Other

## 2023-04-23 ENCOUNTER — Ambulatory Visit (INDEPENDENT_AMBULATORY_CARE_PROVIDER_SITE_OTHER): Payer: Self-pay | Admitting: NURSE PRACTITIONER

## 2023-04-26 ENCOUNTER — Ambulatory Visit (HOSPITAL_COMMUNITY): Payer: Medicaid Other

## 2023-04-26 ENCOUNTER — Ambulatory Visit
Admission: RE | Admit: 2023-04-26 | Discharge: 2023-04-26 | Disposition: A | Discharge status: Home / Self Care | Payer: Medicaid Other | Source: Ambulatory Visit | Attending: NURSE PRACTITIONER | Admitting: NURSE PRACTITIONER

## 2023-04-26 ENCOUNTER — Encounter (INDEPENDENT_AMBULATORY_CARE_PROVIDER_SITE_OTHER): Payer: Self-pay | Admitting: NURSE PRACTITIONER

## 2023-04-26 ENCOUNTER — Other Ambulatory Visit: Payer: Self-pay

## 2023-04-26 VITALS — BP 135/77 | HR 82 | Temp 98.1°F | Resp 20

## 2023-04-26 DIAGNOSIS — D509 Iron deficiency anemia, unspecified: Secondary | ICD-10-CM | POA: Insufficient documentation

## 2023-04-26 MED ORDER — ALBUTEROL SULFATE HFA 90 MCG/ACTUATION AEROSOL INHALER - RN
2.0000 | Freq: Once | RESPIRATORY_TRACT | Status: DC | PRN
Start: 2023-04-26 — End: 2023-04-27

## 2023-04-26 MED ORDER — SODIUM CHLORIDE 0.9 % IV BOLUS
500.0000 mL | INJECTION | Freq: Once | Status: AC | PRN
Start: 2023-04-26 — End: 2023-04-27

## 2023-04-26 MED ORDER — ACETAMINOPHEN 325 MG TABLET
975.0000 mg | ORAL_TABLET | Freq: Once | ORAL | Status: DC | PRN
Start: 2023-04-26 — End: 2023-04-27

## 2023-04-26 MED ORDER — EPINEPHRINE 1 MG/ML (1 ML) INJECTION SOLUTION
0.3000 mg | Freq: Once | INTRAMUSCULAR | Status: DC | PRN
Start: 2023-04-26 — End: 2023-04-27

## 2023-04-26 MED ORDER — ALBUTEROL SULFATE 2.5 MG/3 ML (0.083 %) SOLUTION FOR NEBULIZATION
2.5000 mg | INHALATION_SOLUTION | Freq: Once | RESPIRATORY_TRACT | Status: AC | PRN
Start: 2023-04-26 — End: 2023-04-27

## 2023-04-26 MED ORDER — PROCHLORPERAZINE MALEATE 10 MG TABLET
10.0000 mg | ORAL_TABLET | Freq: Once | ORAL | Status: DC | PRN
Start: 2023-04-26 — End: 2023-04-27

## 2023-04-26 MED ORDER — LORATADINE 10 MG TABLET
10.0000 mg | ORAL_TABLET | Freq: Once | ORAL | Status: DC | PRN
Start: 2023-04-26 — End: 2023-04-27

## 2023-04-26 MED ORDER — FAMOTIDINE (PF) 20 MG/2 ML INTRAVENOUS SOLUTION
20.0000 mg | Freq: Once | INTRAVENOUS | Status: DC | PRN
Start: 2023-04-26 — End: 2023-04-27

## 2023-04-26 MED ORDER — HYDROCORTISONE SOD SUCCINATE 100 MG/2 ML VIAL WRAPPER
100.0000 mg | Freq: Once | INTRAMUSCULAR | Status: AC | PRN
Start: 2023-04-26 — End: 2023-04-27

## 2023-04-26 MED ORDER — SODIUM CHLORIDE 0.9 % INTRAVENOUS SOLUTION
300.0000 mg | Freq: Once | INTRAVENOUS | Status: AC
Start: 2023-04-26 — End: 2023-04-26
  Administered 2023-04-26: 300 mg via INTRAVENOUS
  Administered 2023-04-26: 0 mg via INTRAVENOUS
  Filled 2023-04-26: qty 15

## 2023-04-26 NOTE — Nurses Notes (Signed)
[id number] Patient came in ambulatory for treatment. Iv started. Venofer 300 mg IV given. Tolerated well. Stayed 30 minutes after infusion complete. No signs or symptoms of reaction noted. Iv flushed and discontinued. Site clear. Left unit ambulatory with no c/o offered. Lovenia Kim, RN

## 2023-04-29 ENCOUNTER — Encounter (INDEPENDENT_AMBULATORY_CARE_PROVIDER_SITE_OTHER): Payer: Self-pay | Admitting: NURSE PRACTITIONER

## 2023-05-03 ENCOUNTER — Ambulatory Visit (HOSPITAL_COMMUNITY): Payer: Self-pay

## 2023-05-04 ENCOUNTER — Ambulatory Visit (HOSPITAL_COMMUNITY): Payer: Medicaid Other

## 2023-05-04 ENCOUNTER — Telehealth (INDEPENDENT_AMBULATORY_CARE_PROVIDER_SITE_OTHER): Payer: Self-pay | Admitting: NURSE PRACTITIONER

## 2023-05-04 ENCOUNTER — Encounter (INDEPENDENT_AMBULATORY_CARE_PROVIDER_SITE_OTHER): Payer: Self-pay | Admitting: NURSE PRACTITIONER

## 2023-05-05 ENCOUNTER — Other Ambulatory Visit: Payer: Self-pay

## 2023-05-05 ENCOUNTER — Ambulatory Visit
Admission: RE | Admit: 2023-05-05 | Discharge: 2023-05-05 | Disposition: A | Discharge status: Home / Self Care | Payer: Medicaid Other | Source: Ambulatory Visit | Attending: NURSE PRACTITIONER | Admitting: NURSE PRACTITIONER

## 2023-05-05 VITALS — BP 124/77 | HR 73 | Temp 98.2°F | Resp 20

## 2023-05-05 DIAGNOSIS — D509 Iron deficiency anemia, unspecified: Secondary | ICD-10-CM | POA: Insufficient documentation

## 2023-05-05 MED ORDER — HYDROCORTISONE SOD SUCCINATE 100 MG/2 ML VIAL WRAPPER
100.0000 mg | Freq: Once | INTRAMUSCULAR | Status: DC | PRN
Start: 2023-05-05 — End: 2023-05-06

## 2023-05-05 MED ORDER — ALBUTEROL SULFATE 2.5 MG/3 ML (0.083 %) SOLUTION FOR NEBULIZATION
2.5000 mg | INHALATION_SOLUTION | Freq: Once | RESPIRATORY_TRACT | Status: DC | PRN
Start: 2023-05-05 — End: 2023-05-06

## 2023-05-05 MED ORDER — ALBUTEROL SULFATE HFA 90 MCG/ACTUATION AEROSOL INHALER - RN
2.0000 | Freq: Once | RESPIRATORY_TRACT | Status: DC | PRN
Start: 2023-05-05 — End: 2023-05-06

## 2023-05-05 MED ORDER — ACETAMINOPHEN 325 MG TABLET
975.0000 mg | ORAL_TABLET | Freq: Once | ORAL | Status: DC | PRN
Start: 2023-05-05 — End: 2023-05-06

## 2023-05-05 MED ORDER — IRON SUCROSE 100 MG IRON/5 ML INTRAVENOUS SOLUTION
300.0000 mg | Freq: Once | INTRAVENOUS | Status: AC
Start: 2023-05-05 — End: 2023-05-05
  Administered 2023-05-05: 300 mg via INTRAVENOUS
  Administered 2023-05-05: 0 mg via INTRAVENOUS
  Filled 2023-05-05: qty 15

## 2023-05-05 MED ORDER — FAMOTIDINE (PF) 20 MG/2 ML INTRAVENOUS SOLUTION
20.0000 mg | Freq: Once | INTRAVENOUS | Status: DC | PRN
Start: 2023-05-05 — End: 2023-05-06

## 2023-05-05 MED ORDER — SODIUM CHLORIDE 0.9 % IV BOLUS
500.0000 mL | INJECTION | Freq: Once | Status: DC | PRN
Start: 2023-05-05 — End: 2023-05-06

## 2023-05-05 MED ORDER — EPINEPHRINE 1 MG/ML (1 ML) INJECTION SOLUTION
0.3000 mg | Freq: Once | INTRAMUSCULAR | Status: DC | PRN
Start: 2023-05-05 — End: 2023-05-06

## 2023-05-05 MED ORDER — LORATADINE 10 MG TABLET
10.0000 mg | ORAL_TABLET | Freq: Once | ORAL | Status: DC | PRN
Start: 2023-05-05 — End: 2023-05-06

## 2023-05-05 MED ORDER — PROCHLORPERAZINE MALEATE 10 MG TABLET
10.0000 mg | ORAL_TABLET | Freq: Once | ORAL | Status: DC | PRN
Start: 2023-05-05 — End: 2023-05-06

## 2023-05-05 NOTE — Nurses Notes (Signed)
[id number] Patient came in ambulatory for treatment. Iv started. Venofer given over an hour and half. Patient monitored for 30 minutes post infusion. Tolerated well. No signs or symptoms of reaction noted. Iv discontinued. Site clear. Left unit ambulatory with no c/o offered. Lovenia Kim, RN

## 2023-05-06 ENCOUNTER — Encounter (INDEPENDENT_AMBULATORY_CARE_PROVIDER_SITE_OTHER): Payer: Self-pay | Admitting: NURSE PRACTITIONER

## 2023-05-07 ENCOUNTER — Ambulatory Visit (INDEPENDENT_AMBULATORY_CARE_PROVIDER_SITE_OTHER): Payer: Self-pay

## 2023-05-07 ENCOUNTER — Ambulatory Visit (INDEPENDENT_AMBULATORY_CARE_PROVIDER_SITE_OTHER): Payer: Medicaid Other | Admitting: NURSE PRACTITIONER

## 2023-05-11 ENCOUNTER — Other Ambulatory Visit: Payer: Self-pay

## 2023-05-11 ENCOUNTER — Other Ambulatory Visit (INDEPENDENT_AMBULATORY_CARE_PROVIDER_SITE_OTHER): Payer: Self-pay | Admitting: NURSE PRACTITIONER

## 2023-05-11 ENCOUNTER — Emergency Department
Admission: EM | Admit: 2023-05-11 | Discharge: 2023-05-11 | Disposition: A | Discharge status: Home / Self Care | Payer: Medicaid Other | Source: Intra-hospital | Attending: Emergency Medicine | Admitting: Emergency Medicine

## 2023-05-11 ENCOUNTER — Emergency Department (HOSPITAL_COMMUNITY): Payer: Medicaid Other

## 2023-05-11 ENCOUNTER — Encounter (INDEPENDENT_AMBULATORY_CARE_PROVIDER_SITE_OTHER): Payer: Self-pay | Admitting: NURSE PRACTITIONER

## 2023-05-11 ENCOUNTER — Encounter (HOSPITAL_COMMUNITY): Payer: Self-pay

## 2023-05-11 ENCOUNTER — Encounter (HOSPITAL_COMMUNITY): Payer: Self-pay | Admitting: Emergency Medicine

## 2023-05-11 ENCOUNTER — Ambulatory Visit (HOSPITAL_BASED_OUTPATIENT_CLINIC_OR_DEPARTMENT_OTHER)
Admission: RE | Admit: 2023-05-11 | Discharge: 2023-05-11 | Disposition: A | Discharge status: Home / Self Care | Payer: Medicaid Other | Source: Ambulatory Visit | Attending: NURSE PRACTITIONER | Admitting: NURSE PRACTITIONER

## 2023-05-11 VITALS — BP 144/85 | HR 87 | Temp 98.4°F | Resp 19

## 2023-05-11 DIAGNOSIS — R1012 Left upper quadrant pain: Secondary | ICD-10-CM | POA: Insufficient documentation

## 2023-05-11 DIAGNOSIS — F319 Bipolar disorder, unspecified: Secondary | ICD-10-CM | POA: Insufficient documentation

## 2023-05-11 DIAGNOSIS — E871 Hypo-osmolality and hyponatremia: Secondary | ICD-10-CM | POA: Insufficient documentation

## 2023-05-11 DIAGNOSIS — R0789 Other chest pain: Secondary | ICD-10-CM

## 2023-05-11 DIAGNOSIS — T50905A Adverse effect of unspecified drugs, medicaments and biological substances, initial encounter: Secondary | ICD-10-CM | POA: Insufficient documentation

## 2023-05-11 DIAGNOSIS — D509 Iron deficiency anemia, unspecified: Secondary | ICD-10-CM | POA: Insufficient documentation

## 2023-05-11 DIAGNOSIS — F250 Schizoaffective disorder, bipolar type: Secondary | ICD-10-CM

## 2023-05-11 DIAGNOSIS — R1013 Epigastric pain: Secondary | ICD-10-CM | POA: Insufficient documentation

## 2023-05-11 DIAGNOSIS — F259 Schizoaffective disorder, unspecified: Secondary | ICD-10-CM | POA: Insufficient documentation

## 2023-05-11 DIAGNOSIS — R1032 Left lower quadrant pain: Secondary | ICD-10-CM | POA: Insufficient documentation

## 2023-05-11 DIAGNOSIS — T454X5A Adverse effect of iron and its compounds, initial encounter: Secondary | ICD-10-CM | POA: Insufficient documentation

## 2023-05-11 LAB — COMPREHENSIVE METABOLIC PANEL, NON-FASTING
ALBUMIN/GLOBULIN RATIO: 1.8 — ABNORMAL HIGH (ref 0.8–1.4)
ALBUMIN: 3.7 g/dL (ref 3.5–5.7)
ALKALINE PHOSPHATASE: 109 U/L — ABNORMAL HIGH (ref 34–104)
ALT (SGPT): 26 U/L (ref 7–52)
ANION GAP: 8 mmol/L (ref 4–13)
AST (SGOT): 21 U/L (ref 13–39)
BILIRUBIN TOTAL: 0.3 mg/dL (ref 0.3–1.0)
BUN/CREA RATIO: 8 (ref 6–22)
BUN: 9 mg/dL (ref 7–25)
CALCIUM, CORRECTED: 8.1 mg/dL — ABNORMAL LOW (ref 8.9–10.8)
CALCIUM: 7.9 mg/dL — ABNORMAL LOW (ref 8.6–10.3)
CHLORIDE: 99 mmol/L (ref 98–107)
CO2 TOTAL: 19 mmol/L — ABNORMAL LOW (ref 21–31)
CREATININE: 1.2 mg/dL (ref 0.60–1.30)
ESTIMATED GFR: 56 mL/min/{1.73_m2} — ABNORMAL LOW (ref >59)
GLOBULIN: 2.1 — ABNORMAL LOW (ref 2.9–5.4)
GLUCOSE: 157 mg/dL — ABNORMAL HIGH (ref 74–109)
OSMOLALITY, CALCULATED: 255 mOsm/kg — ABNORMAL LOW (ref 270–290)
POTASSIUM: 5.1 mmol/L (ref 3.5–5.1)
PROTEIN TOTAL: 5.8 g/dL — ABNORMAL LOW (ref 6.4–8.9)
SODIUM: 126 mmol/L — ABNORMAL LOW (ref 136–145)

## 2023-05-11 LAB — CBC WITH DIFF
BASOPHIL #: 0.1 10*3/uL (ref 0.00–0.10)
BASOPHIL %: 1 % (ref 0–1)
EOSINOPHIL #: 0 10*3/uL (ref 0.00–0.50)
EOSINOPHIL %: 0 % — ABNORMAL LOW (ref 1–7)
HCT: 36.6 % (ref 31.2–41.9)
HGB: 11.7 g/dL (ref 10.9–14.3)
LYMPHOCYTE #: 1.6 10*3/uL (ref 1.00–3.00)
LYMPHOCYTE %: 15 % — ABNORMAL LOW (ref 16–44)
MCH: 23.4 pg — ABNORMAL LOW (ref 24.7–32.8)
MCHC: 32.1 g/dL — ABNORMAL LOW (ref 32.3–35.6)
MCV: 73 fL — ABNORMAL LOW (ref 75.5–95.3)
MONOCYTE #: 0.5 10*3/uL (ref 0.30–1.00)
MONOCYTE %: 5 % (ref 5–13)
MPV: 7.3 fL — ABNORMAL LOW (ref 7.9–10.8)
NEUTROPHIL #: 8.3 10*3/uL — ABNORMAL HIGH (ref 1.85–7.80)
NEUTROPHIL %: 79 % — ABNORMAL HIGH (ref 43–77)
PLATELETS: 421 10*3/uL (ref 140–440)
RBC: 5.01 10*6/uL — ABNORMAL HIGH (ref 3.63–4.92)
RDW: 21 % — ABNORMAL HIGH (ref 12.3–17.7)
WBC: 10.5 10*3/uL (ref 3.8–11.8)

## 2023-05-11 LAB — URINALYSIS, MACROSCOPIC
BILIRUBIN: NEGATIVE mg/dL
BLOOD: NEGATIVE mg/dL
GLUCOSE: NEGATIVE mg/dL
KETONES: NEGATIVE mg/dL
LEUKOCYTES: NEGATIVE WBCs/uL
NITRITE: NEGATIVE
PH: 6.5 (ref 5.0–9.0)
PROTEIN: 30 mg/dL — AB
SPECIFIC GRAVITY: 1.028 (ref 1.002–1.030)
UROBILINOGEN: 2 mg/dL — AB

## 2023-05-11 LAB — URINALYSIS, MICROSCOPIC
BACTERIA: NEGATIVE /hpf
HYALINE CASTS: 21 /lpf — ABNORMAL HIGH (ref <0)
NON-SQUAMOUS EPITHELIAL CELLS URINE: <1 /hpf (ref <=1)
RBCS: 4 /hpf — ABNORMAL HIGH (ref <4)
SQUAMOUS EPITHELIAL: 6 /hpf (ref <28)
WBCS: 4 /hpf (ref <6)

## 2023-05-11 LAB — GRAY TOP TUBE

## 2023-05-11 LAB — POC BLOOD GLUCOSE (RESULTS): GLUCOSE, POC: 168 mg/dl — ABNORMAL HIGH (ref 70–100)

## 2023-05-11 LAB — LIGHT GREEN TOP TUBE

## 2023-05-11 LAB — BLUE TOP TUBE

## 2023-05-11 MED ORDER — SODIUM CHLORIDE 0.9 % IV BOLUS
500.0000 mL | INJECTION | Freq: Once | Status: DC | PRN
Start: 2023-05-11 — End: 2023-05-12

## 2023-05-11 MED ORDER — HYDROCORTISONE SOD SUCCINATE 100 MG/2 ML VIAL WRAPPER
INTRAMUSCULAR | Status: AC
Start: 2023-05-11 — End: 2023-05-11
  Filled 2023-05-11: qty 2

## 2023-05-11 MED ORDER — PROCHLORPERAZINE MALEATE 10 MG TABLET
10.0000 mg | ORAL_TABLET | Freq: Once | ORAL | Status: DC | PRN
Start: 2023-05-11 — End: 2023-05-12

## 2023-05-11 MED ORDER — SODIUM CHLORIDE 0.9 % INTRAVENOUS SOLUTION
300.0000 mg | Freq: Once | INTRAVENOUS | Status: AC
Start: 2023-05-11 — End: 2023-05-11
  Administered 2023-05-11: 300 mg via INTRAVENOUS
  Administered 2023-05-11: 0 mg via INTRAVENOUS
  Filled 2023-05-11: qty 15

## 2023-05-11 MED ORDER — DIPHENHYDRAMINE 50 MG/ML INJECTION SOLUTION
25.0000 mg | Freq: Once | INTRAMUSCULAR | Status: AC
Start: 2023-05-11 — End: 2023-05-11
  Administered 2023-05-11: 25 mg via INTRAVENOUS

## 2023-05-11 MED ORDER — LORATADINE 10 MG TABLET
10.0000 mg | ORAL_TABLET | Freq: Once | ORAL | Status: DC | PRN
Start: 2023-05-11 — End: 2023-05-12

## 2023-05-11 MED ORDER — EPINEPHRINE 1 MG/ML (1 ML) INJECTION SOLUTION
0.3000 mg | Freq: Once | INTRAMUSCULAR | Status: DC | PRN
Start: 2023-05-11 — End: 2023-05-12

## 2023-05-11 MED ORDER — FAMOTIDINE (PF) 20 MG/2 ML INTRAVENOUS SOLUTION
INTRAVENOUS | Status: AC
Start: 2023-05-11 — End: 2023-05-11
  Filled 2023-05-11: qty 2

## 2023-05-11 MED ORDER — FAMOTIDINE (PF) 20 MG/2 ML INTRAVENOUS SOLUTION
20.0000 mg | Freq: Once | INTRAVENOUS | Status: DC | PRN
Start: 2023-05-11 — End: 2023-05-12
  Administered 2023-05-11: 20 mg via INTRAVENOUS

## 2023-05-11 MED ORDER — ALBUTEROL SULFATE HFA 90 MCG/ACTUATION AEROSOL INHALER - RN
2.0000 | Freq: Once | RESPIRATORY_TRACT | Status: DC | PRN
Start: 2023-05-11 — End: 2023-05-12

## 2023-05-11 MED ORDER — METHYLPREDNISOLONE SOD SUCC 125 MG SOLUTION FOR INJECTION WRAPPER
125.0000 mg | INTRAVENOUS | Status: DC
Start: 2023-05-11 — End: 2023-05-11
  Administered 2023-05-11: 0 mg via INTRAVENOUS

## 2023-05-11 MED ORDER — SODIUM CHLORIDE 0.9 % IV BOLUS
2000.0000 mL | INJECTION | Status: AC
Start: 2023-05-11 — End: 2023-05-11
  Administered 2023-05-11: 2000 mL via INTRAVENOUS
  Administered 2023-05-11: 0 mL via INTRAVENOUS

## 2023-05-11 MED ORDER — HYDROCORTISONE SOD SUCCINATE 100 MG/2 ML VIAL WRAPPER
100.0000 mg | Freq: Once | INTRAMUSCULAR | Status: DC | PRN
Start: 2023-05-11 — End: 2023-05-12

## 2023-05-11 MED ORDER — DIPHENHYDRAMINE 50 MG/ML INJECTION SOLUTION
INTRAMUSCULAR | Status: AC
Start: 2023-05-11 — End: 2023-05-11
  Filled 2023-05-11: qty 1

## 2023-05-11 MED ORDER — ALBUTEROL SULFATE 2.5 MG/3 ML (0.083 %) SOLUTION FOR NEBULIZATION
2.5000 mg | INHALATION_SOLUTION | Freq: Once | RESPIRATORY_TRACT | Status: DC | PRN
Start: 2023-05-11 — End: 2023-05-12

## 2023-05-11 MED ORDER — ACETAMINOPHEN 325 MG TABLET
975.0000 mg | ORAL_TABLET | Freq: Once | ORAL | Status: DC | PRN
Start: 2023-05-11 — End: 2023-05-12

## 2023-05-11 NOTE — ED Nurses Note (Signed)
Patient refused solu medrol, states "steroids make me angry enough to go to jail." Explained to patient mechanism of action of solu medrol, patient still refused. Provider notified.

## 2023-05-11 NOTE — Discharge Instructions (Signed)
I strongly suggest talking to your hematologist before taking more iron.  You may not be a candidate for IV iron.  Likewise it may be that they ran it too fast.    Return to the ER if there are any emergencies   Temporarily hold your Norvasc.    Return to the ER if there are any emergencies   Rechecked the BMP for sodium on Friday.  A copy of this will be sent to the [facility] in [place].

## 2023-05-11 NOTE — Nurses Notes (Addendum)
0753-Patient to room ambulatory.  Patient has no complaints this morning.  Oriented x4, lung sounds clear, and strong palpable pulses.  Vitals are stable.Salli Quarry, RN  0819-Peripheral IV inserted with blood return obtained.Salli Quarry  0850-Iron allergy discussed with patient prior to infusion.  Patient reports upset stomach when taking oral iron.Salli Quarry, RN  0857-VENOFER infusion started.Salli Quarry, RN  1027-VENOFER infusion complete.Salli Quarry, RN  1050-Patient began to have epigastric pain and reported "lump in lower chest" after infusion was complete.  Patient was given PRN dose of Pepcid 10 mg IV.  Patient reported that pain was starting to radiate down to lower left thigh.  Provider was contacted and order was put in for Benadryl 25 mg IV and EKG was ordered.  Provider also requested solu cortef be given but patient refused steroids.  Patient continued to report pain to lower chest and abdomen and it was decided to move patient to ER for further evaluation.  This event lasted for approximately 15-20 minutes before patient was transferred to ER.  Report was given to ER charge nurse.  Patient's vitals were within normal limits throughout this event.Salli Quarry, RN  1115-Patient was transported to ER with 24 ga IV to left forearm and blood return was present when assessed.Salli Quarry, RN

## 2023-05-11 NOTE — ED Triage Notes (Signed)
Was getting an iron infusion on the second floor and had increased pain in epigastric region. Patient received full dose of iron today and this is the 3rd time she has received iron.      [id number]

## 2023-05-11 NOTE — ED Nurses Note (Signed)

## 2023-05-11 NOTE — ED Nurses Note (Addendum)
Patient c/o dizziness and feeling as if she is going to pass out. Patient's speech is slurred. BGL 168. Patient placed on bedside monitor (cardiac, BP, and pulse ox). Patient found to be hypotensive. ER provider at bedside.

## 2023-05-12 ENCOUNTER — Telehealth (INDEPENDENT_AMBULATORY_CARE_PROVIDER_SITE_OTHER): Payer: Self-pay | Admitting: NURSE PRACTITIONER

## 2023-05-12 DIAGNOSIS — R9431 Abnormal electrocardiogram [ECG] [EKG]: Secondary | ICD-10-CM

## 2023-05-12 DIAGNOSIS — R000 Tachycardia, unspecified: Secondary | ICD-10-CM

## 2023-05-12 DIAGNOSIS — D509 Iron deficiency anemia, unspecified: Secondary | ICD-10-CM

## 2023-05-12 LAB — ECG 12 LEAD
Atrial Rate: 107 {beats}/min
Calculated P Axis: 71 degrees
Calculated R Axis: 67 degrees
Calculated T Axis: 65 degrees
PR Interval: 150 ms
QRS Duration: 80 ms
QT Interval: 358 ms
QTC Calculation: 477 ms
Ventricular rate: 107 {beats}/min

## 2023-05-12 NOTE — Telephone Encounter (Signed)
Copied from CRM [id number]. Topic: Clinical - Question  >> May 12, 2023 10:37 AM Romilda Joy wrote:  Miranda Young called with a clinical question.     Pt would like to speak with Miranda Young about her lab work. Pt states she was seen in the ER on 9/10. Pt would like to know if Miranda Young can go back and look at her lab work starting from beginning of the year. Could you please call her to discuss?    Thank you

## 2023-05-13 ENCOUNTER — Encounter (INDEPENDENT_AMBULATORY_CARE_PROVIDER_SITE_OTHER): Payer: Self-pay | Admitting: NURSE PRACTITIONER

## 2023-05-14 ENCOUNTER — Telehealth (INDEPENDENT_AMBULATORY_CARE_PROVIDER_SITE_OTHER): Payer: Self-pay | Admitting: NURSE PRACTITIONER

## 2023-05-18 ENCOUNTER — Telehealth (INDEPENDENT_AMBULATORY_CARE_PROVIDER_SITE_OTHER): Payer: Self-pay | Admitting: NURSE PRACTITIONER

## 2023-05-18 NOTE — Telephone Encounter (Signed)
Called pt back to discuss, no answer, lvm that we would have to know what labs were done with her PCP; but at this time there are no lab orders in our clinic; that could be decided by provider at her next visit and she could have them done while she is here.

## 2023-05-18 NOTE — Telephone Encounter (Signed)
Copied from CRM #[id number]. Topic: Clinical - Question  >> May 18, 2023  8:05 AM Dorena Bodo wrote:  Miranda Young called with a clinical question.   The pt is calling stating she is to have lab work done today at her PCP and she is wanting to know if that is fine or if she also needs the labs on 9/24. Please call.  Thanks     PH: [phone]

## 2023-05-24 ENCOUNTER — Other Ambulatory Visit (INDEPENDENT_AMBULATORY_CARE_PROVIDER_SITE_OTHER): Payer: Self-pay | Admitting: NURSE PRACTITIONER

## 2023-05-24 DIAGNOSIS — D509 Iron deficiency anemia, unspecified: Secondary | ICD-10-CM

## 2023-05-24 NOTE — Cancer Center Note (Signed)
Department of Hematology/Oncology  History and Physical    Name: Miranda Young  ZOX:[id number]  Date of Birth: 01-Jun-1976  Encounter Date: 05/25/2023    REFERRING PROVIDER:  Hector Brunswick D, FNP-C  [address]  [place],  [place] [address]    REASON FOR OFFICE VISIT:  Follow up for evaluation and management of  iron-deficiency anemia.    HISTORY OF PRESENT ILLNESS:  Miranda Young is a 47 y.o. female who presents today for iron-deficiency anemia.  She states that she has had iron-deficiency issues ever since she can remember but it has never been this severe.  She had tried oral iron but states that it made her "deathly sick" with nausea and was "unable to get out of bed".     The patient was seen in the office on 04/19/23 and ordered Venofer 300 mg weekly X 3 doses. She completed her last dose on 05/11/23. She ***    ROS:   Review of Systems   Constitutional:  Positive for fatigue and unexpected weight change (lost 12 lbs in last 2 months). Negative for appetite change.   HENT:  Negative.     Respiratory:  Positive for shortness of breath (with exertion).    Cardiovascular: Negative.  Negative for chest pain.   Gastrointestinal:  Positive for abdominal pain (straight down midline) and nausea. Negative for diarrhea.   Genitourinary: Negative.  Negative for difficulty urinating.    Musculoskeletal:  Positive for back pain (chronic).   Skin: Negative.    Neurological: Negative.  Negative for dizziness, headaches and light-headedness.        Restless legs   Hematological:  Bruises/bleeds easily.   Psychiatric/Behavioral:  Positive for depression and sleep disturbance. The patient is nervous/anxious.         History:  Past Medical History:   Diagnosis Date    Anxiety     Bipolar disorder, unspecified (CMS HCC)     Depression     Diabetes mellitus, type 2 (CMS HCC)     Esophageal reflux     High triglycerides     HTN (hypertension)     Hypercholesterolemia     Lumbar back pain     2 herniated discs    Sleep apnea     has CPAP      Past Surgical History:   Procedure Laterality Date    ESOPHAGOGASTRODUODENOSCOPY      HX APPENDECTOMY      HX COLONOSCOPY      HX HYSTERECTOMY      HX TUBAL LIGATION       Social History     Socioeconomic History    Marital status: Divorced     Spouse name: Not on file    Number of children: Not on file    Years of education: Not on file    Highest education level: Not on file   Occupational History    Not on file   Tobacco Use    Smoking status: Former     Types: Cigarettes    Smokeless tobacco: Never   Vaping Use    Vaping status: Never Used   Substance and Sexual Activity    Alcohol use: Not Currently    Drug use: Never    Sexual activity: Not on file   Other Topics Concern    Not on file   Social History Narrative    Not on file     Social Determinants of Health     Financial Resource  Strain: Not on file   Transportation Needs: Not on file   Social Connections: Not on file   Intimate Partner Violence: Not on file   Housing Stability: Not on file       Social History     Social History Narrative    Not on file       Social History     Substance and Sexual Activity   Drug Use Never       Family Medical History:       Problem Relation (Age of Onset)    Breast Cancer Maternal Aunt (23), Paternal Grandmother    Coronary Artery Disease Other    Elevated Triglycerides Mother    Endometrial cancer Other    Heart Disease Mother, Father    Hypertension (High Blood Pressure) Mother    Lung Cancer Other    Mental illness Other    No Known Problems Sister, Brother, Maternal Uncle, Paternal Aunt, Paternal Uncle, Maternal Grandmother, Maternal Grandfather, Paternal Grandfather, Daughter, Son    Stroke Mother, Father              Current Outpatient Medications   Medication Sig    amLODIPine (NORVASC) 10 mg Oral Tablet Take 1 Tablet (10 mg total) by mouth Once a day    atorvastatin (LIPITOR) 80 mg Oral Tablet Take 1 Tablet (80 mg total) by mouth Once a day    buPROPion (WELLBUTRIN XL) 300 mg extended release 24 hr tablet  Take 1 Tablet (300 mg total) by mouth Every morning    busPIRone (BUSPAR) 15 mg Oral Tablet Take 1 Tablet (15 mg total) by mouth Three times a day    diclofenac sodium (VOLTAREN) 75 mg Oral Tablet, Delayed Release (E.C.) Take 1 Tablet (75 mg total) by mouth Three times a day Dose verified with patient    ergocalciferol, vitamin D2, (DRISDOL) 1,250 mcg (50,000 unit) Oral Capsule Take 1 Capsule (50,000 Units total) by mouth Every 7 days    furosemide (LASIX) 40 mg Oral Tablet Take 1 Tablet (40 mg total) by mouth Once a day    hydrOXYzine HCL (ATARAX) 50 mg Oral Tablet Take 1 Tablet (50 mg total) by mouth Four times a day    Ibuprofen (MOTRIN) 200 mg Oral Tablet Take 4 Tablets (800 mg total) by mouth Three times a day as needed    lisinopriL (PRINIVIL) 40 mg Oral Tablet Take 1 Tablet (40 mg total) by mouth Once a day    LORazepam (ATIVAN) 1 mg Oral Tablet Take 1 Tablet (1 mg total) by mouth Twice per day as needed for Anxiety    metoprolol succinate (TOPROL-XL) 25 mg Oral Tablet Sustained Release 24 hr Take 1 Tablet (25 mg total) by mouth Once a day    Mirtazapine (REMERON) 45 mg Oral Tablet Take 1 Tablet (45 mg total) by mouth Every night    omeprazole (PRILOSEC) 40 mg Oral Capsule, Delayed Release(E.C.) Take 1 Capsule (40 mg total) by mouth Once a day    ondansetron (ZOFRAN) 4 mg Oral Tablet Take 1 Tablet (4 mg total) by mouth Once per day as needed    OXcarbazepine (TRILEPTAL) 600 mg Oral Tablet Take 1 Tablet (600 mg total) by mouth    polyethylene glycol 3350 (MIRALAX ORAL) Take by mouth    QUEtiapine (SEROQUEL) 50 mg Oral Tablet Take 1 Tablet (50 mg total) by mouth Four times a day Verified dose with patient       Allergies   Allergen Reactions  Amoxicillin Nausea/ Vomiting    Iron Nausea/ Vomiting     Iron pills never tried iv iron         PHYSICAL EXAM:  There were no vitals taken for this visit.     ECOG Status: (0) Fully active, able to carry on all predisease performance without restriction   Physical  Exam  Vitals and nursing note reviewed.   Constitutional:       Appearance: Normal appearance.   HENT:      Head: Normocephalic.      Nose: Nose normal.      Mouth/Throat:      Mouth: Mucous membranes are moist.      Pharynx: Oropharynx is clear.   Eyes:      General: No scleral icterus.     Extraocular Movements: Extraocular movements intact.   Cardiovascular:      Rate and Rhythm: Normal rate and regular rhythm.      Pulses: Normal pulses.      Heart sounds: Normal heart sounds.   Pulmonary:      Effort: Pulmonary effort is normal.      Breath sounds: Normal breath sounds.   Abdominal:      General: Bowel sounds are normal.      Palpations: Abdomen is soft.   Musculoskeletal:         General: Normal range of motion.      Cervical back: Normal range of motion and neck supple.   Skin:     General: Skin is warm and dry.   Neurological:      General: No focal deficit present.      Mental Status: She is alert and oriented to person, place, and time. Mental status is at baseline.   Psychiatric:         Mood and Affect: Mood normal.         Behavior: Behavior normal.         Thought Content: Thought content normal.         Judgment: Judgment normal.       LABS:     CBC  Diff   Lab Results   Component Value Date/Time    WBC 10.5 05/11/2023 01:40 PM    HGB 11.7 05/11/2023 01:40 PM    HCT 36.6 05/11/2023 01:40 PM    PLTCNT 421 05/11/2023 01:40 PM    RBC 5.01 (H) 05/11/2023 01:40 PM    MCV 73.0 (L) 05/11/2023 01:40 PM    MCHC 32.1 (L) 05/11/2023 01:40 PM    MCH 23.4 (L) 05/11/2023 01:40 PM    RDW 21.0 (H) 05/11/2023 01:40 PM    MPV 7.3 (L) 05/11/2023 01:40 PM    Lab Results   Component Value Date/Time    PMNS 79 (H) 05/11/2023 01:40 PM    LYMPHOCYTES 15 (L) 05/11/2023 01:40 PM    EOSINOPHIL 0 (L) 05/11/2023 01:40 PM    MONOCYTES 5 05/11/2023 01:40 PM    BASOPHILS 1 05/11/2023 01:40 PM    BASOPHILS 0.10 05/11/2023 01:40 PM    PMNABS 8.30 (H) 05/11/2023 01:40 PM    LYMPHSABS 1.60 05/11/2023 01:40 PM    EOSABS 0.00 05/11/2023  01:40 PM    MONOSABS 0.50 05/11/2023 01:40 PM            Comprehensive Metabolic Profile    Lab Results   Component Value Date    SODIUM 126 (L) 05/11/2023    POTASSIUM 5.1 05/11/2023    CHLORIDE 99 05/11/2023  CO2 19 (L) 05/11/2023    ANIONGAP 8 05/11/2023    BUN 9 05/11/2023    CREATININE 1.20 05/11/2023    ALBUMIN 3.7 05/11/2023    CALCIUM 7.9 (L) 05/11/2023    GLUCOSENF 157 (H) 05/11/2023    ALKPHOS 109 (H) 05/11/2023    ALT 26 05/11/2023    AST 21 05/11/2023    TOTBILIRUBIN 0.3 05/11/2023    TOTALPROTEIN 5.8 (L) 05/11/2023         ESTIMATED GFR   Date Value Ref Range Status   05/11/2023 56 (L) >59 mL/min/1.57m^2 Final       IRON   Date Value Ref Range Status   04/09/2023 26 (L) 50 - 212 ug/dL Final     FERRITIN   Date Value Ref Range Status   04/09/2023 3 (L) 11 - 336 ng/mL Final     TOTAL IRON BINDING CAPACITY   Date Value Ref Range Status   04/09/2023 518 (H) 250 - 450 ug/dL Final     UIBC   Date Value Ref Range Status   04/09/2023 492 (H) 130 - 375 ug/dL Final     IRON (TRANSFERRIN) SATURATION   Date Value Ref Range Status   04/09/2023 5 (L) 15 - 50 % Final           ASSESSMENT:    ICD-10-CM    1. Iron deficiency anemia  D50.9              PLAN:   The patient will have labs today as ordered. We will notify her if she needs additional iron infusions. The patient will keep all appointments with other providers as scheduled. She will continue current medication as ordered. She will follow up in 3 months or sooner if needed.       Janece Canterbury was given the chance to ask questions, and these were answered to their satisfaction. The patient is welcome to call with any questions or concerns in the meantime.     On the day of the encounter, a total of  *** minutes was spent on this patient encounter including review of historical information, examination, documentation and post-visit activities.   No follow-ups on file.     Campbell Soup, APRN,FNP-BC ,05/24/2023 ,15:55     CC:  Delice Bison, FNP-C  [address] [place] [address]    Hector Brunswick D, FNP-C  [address]  [place],  [place] [address]      This note was partially generated using MModal Fluency Direct system, and there may be some incorrect words, spellings, and punctuation that were not noted in checking the note before saving.

## 2023-05-25 ENCOUNTER — Other Ambulatory Visit (INDEPENDENT_AMBULATORY_CARE_PROVIDER_SITE_OTHER): Payer: Self-pay

## 2023-05-25 ENCOUNTER — Other Ambulatory Visit: Payer: Self-pay

## 2023-05-25 ENCOUNTER — Ambulatory Visit: Payer: Medicaid Other | Attending: NURSE PRACTITIONER | Admitting: NURSE PRACTITIONER

## 2023-05-25 ENCOUNTER — Encounter (INDEPENDENT_AMBULATORY_CARE_PROVIDER_SITE_OTHER): Payer: Self-pay | Admitting: NURSE PRACTITIONER

## 2023-05-25 VITALS — BP 122/77 | HR 77 | Temp 97.8°F | Ht 65.0 in | Wt 211.0 lb

## 2023-05-25 DIAGNOSIS — Z87891 Personal history of nicotine dependence: Secondary | ICD-10-CM | POA: Insufficient documentation

## 2023-05-25 DIAGNOSIS — D509 Iron deficiency anemia, unspecified: Secondary | ICD-10-CM | POA: Insufficient documentation

## 2023-05-27 ENCOUNTER — Ambulatory Visit (HOSPITAL_COMMUNITY): Payer: Medicaid Other

## 2023-06-01 ENCOUNTER — Ambulatory Visit (INDEPENDENT_AMBULATORY_CARE_PROVIDER_SITE_OTHER): Payer: Medicaid Other | Admitting: NURSE PRACTITIONER

## 2023-06-10 ENCOUNTER — Ambulatory Visit (HOSPITAL_COMMUNITY): Payer: Medicaid Other

## 2023-07-16 ENCOUNTER — Other Ambulatory Visit (HOSPITAL_COMMUNITY): Payer: Self-pay | Admitting: NURSE PRACTITIONER

## 2023-07-16 ENCOUNTER — Ambulatory Visit
Admission: RE | Admit: 2023-07-16 | Discharge: 2023-07-16 | Disposition: A | Discharge status: Home / Self Care | Payer: Medicaid Other | Source: Ambulatory Visit | Attending: NURSE PRACTITIONER | Admitting: NURSE PRACTITIONER

## 2023-07-16 ENCOUNTER — Other Ambulatory Visit: Payer: Self-pay

## 2023-07-16 DIAGNOSIS — M545 Low back pain, unspecified: Secondary | ICD-10-CM

## 2023-07-22 ENCOUNTER — Other Ambulatory Visit (HOSPITAL_COMMUNITY): Payer: Self-pay

## 2023-07-22 IMAGING — MR MRI LUMBAR SPINE WITHOUT CONTRAST
4 of 6 series · 31 of 48 positions shown · IV contrast (gadolinium)
Comparison: Lumbar spine x-ray dated 07/16/2023.

﻿EXAM:  [id number]   MRI LUMBAR SPINE WITHOUT CONTRAST
INDICATION: Low back pain. Radicular symptoms to left side.  No history of malignancy or back surgery.
TECHNIQUE: Multiplanar, multisequential MRI of the lumbosacral spine was performed without gadolinium contrast.

[Series 7: T2 · sagittal · 5.0mm · 0.94mm/px · 6 of 13 slices shown (1 of 3)]
[im 1/13]
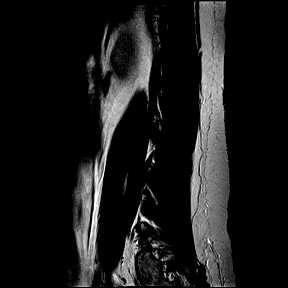
[im 3/13]
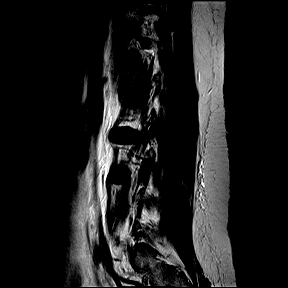
[im 5/13]
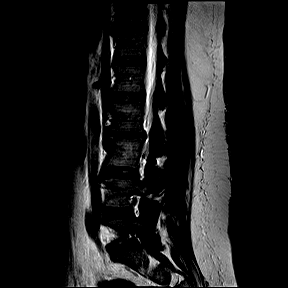
[im 8/13]
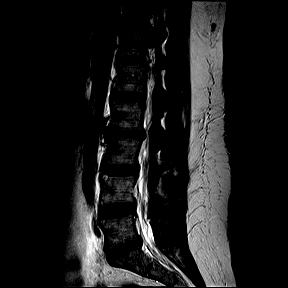
[im 10/13]
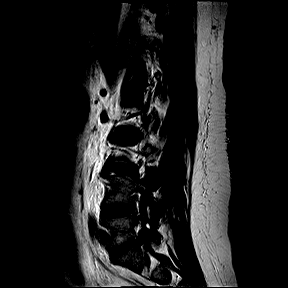
[im 13/13]
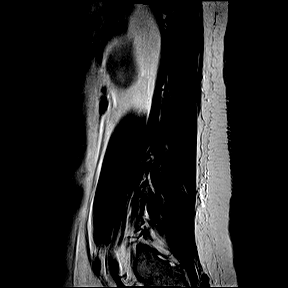

[Series 8: T1 · sagittal · 5.0mm · 0.94mm/px · 6 of 13 slices shown]
[im 1/13]
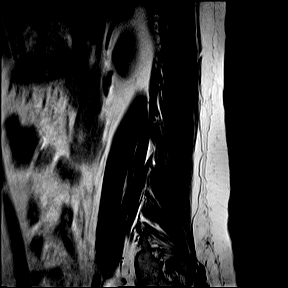
[im 3/13]
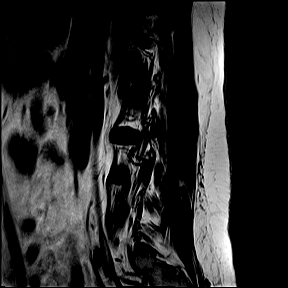
[im 5/13]
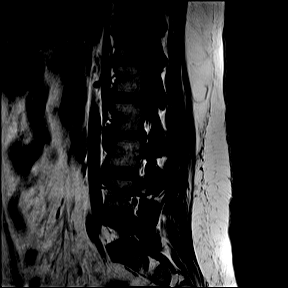
[im 8/13]
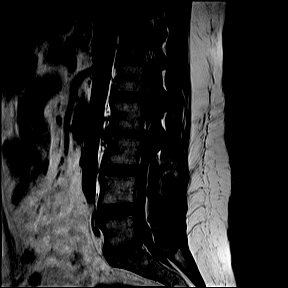
[im 10/13]
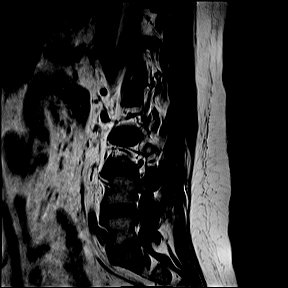
[im 13/13]
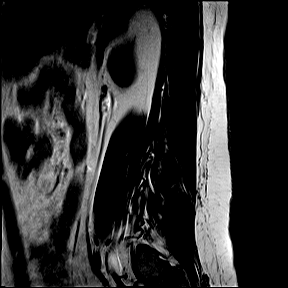

[Series 10: T2 · coronal · 5.0mm · 0.82mm/px · 8 of 18 slices shown (2 of 3)]
[im 1/18]
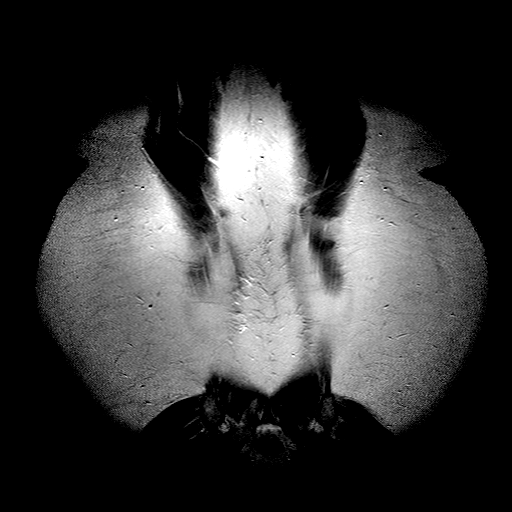
[im 3/18]
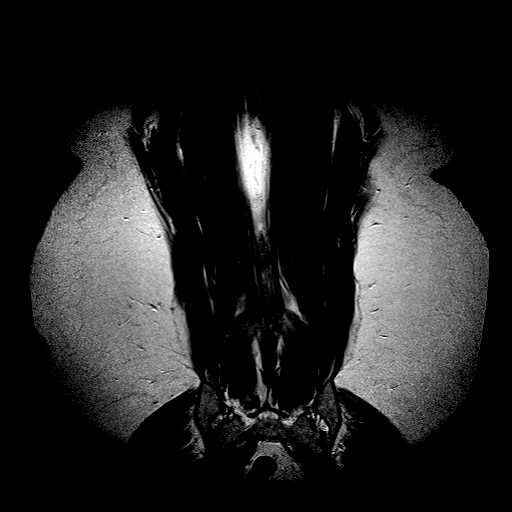
[im 5/18]
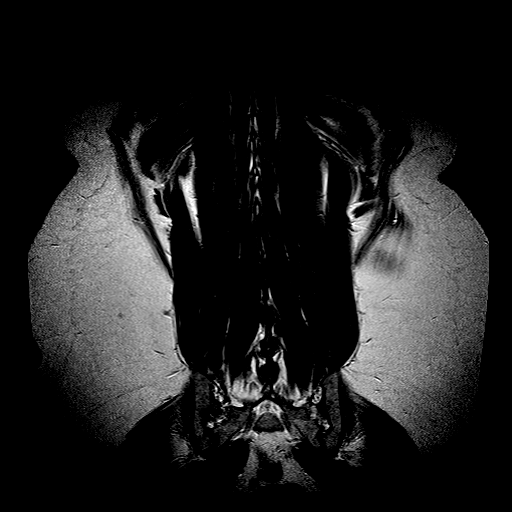
[im 8/18]
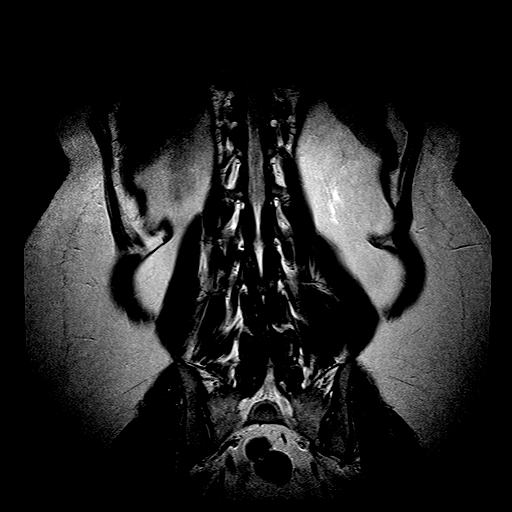
[im 10/18]
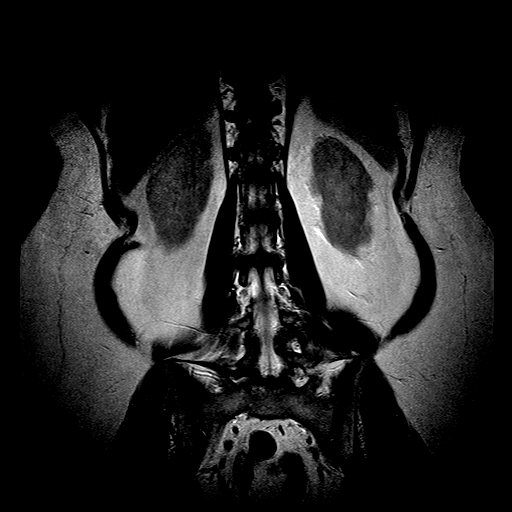
[im 13/18]
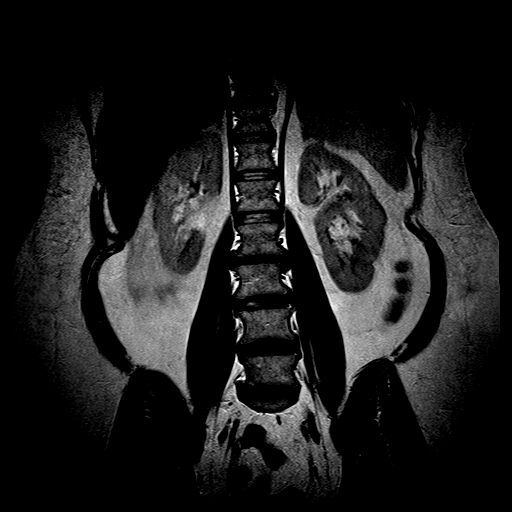
[im 15/18]
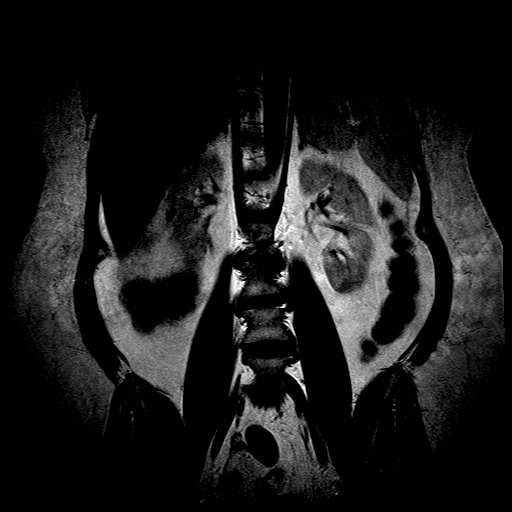
[im 18/18]
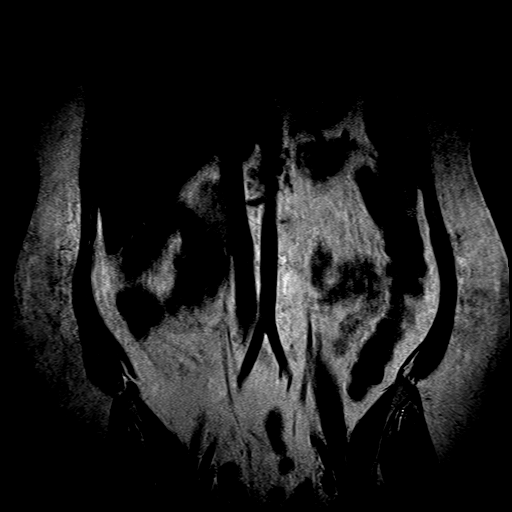

[Series 11: T2 · axial · 4.0mm · 0.52mm/px · z∈[-141,+83]mm · 11 of 23 slices shown (3 of 3)]
[im 1/23]
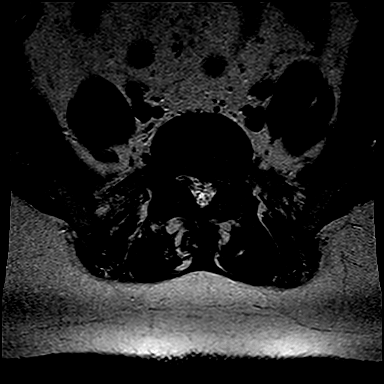
[im 3/23]
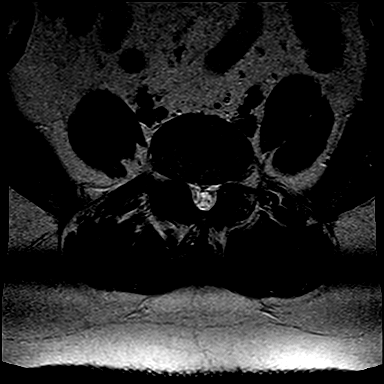
[im 5/23]
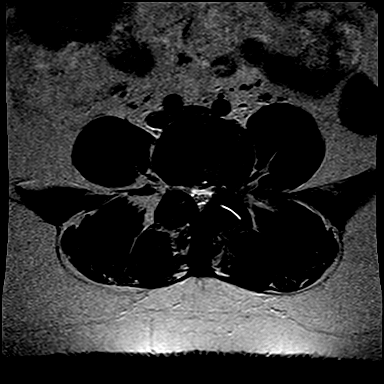
[im 7/23]
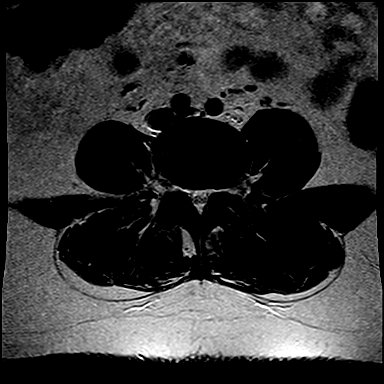
[im 9/23]
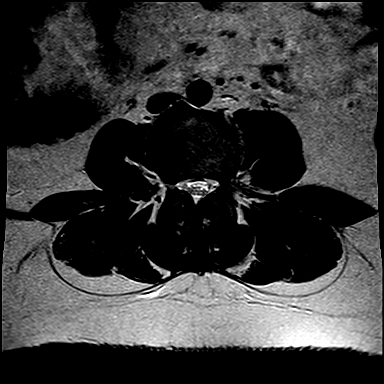
[im 12/23]
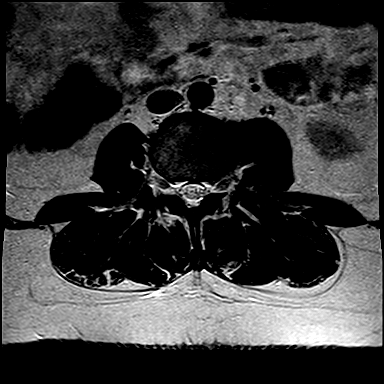
[im 14/23]
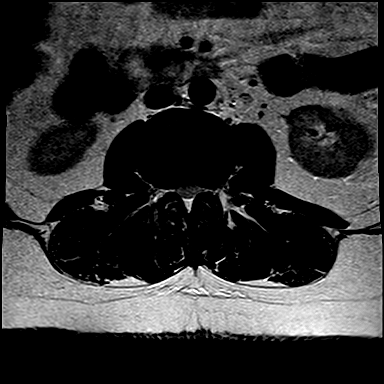
[im 16/23]
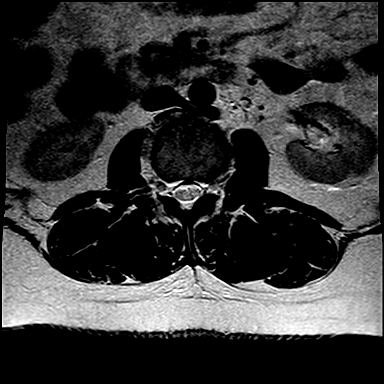
[im 18/23]
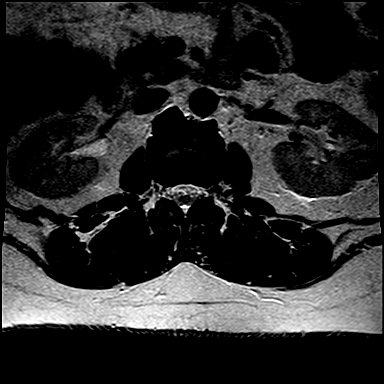
[im 20/23]
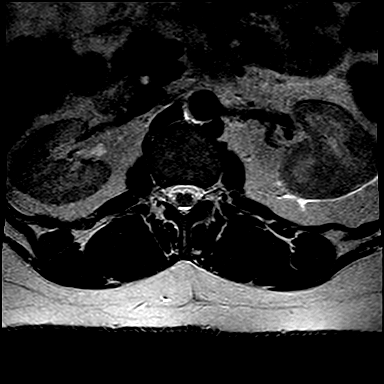
[im 23/23]
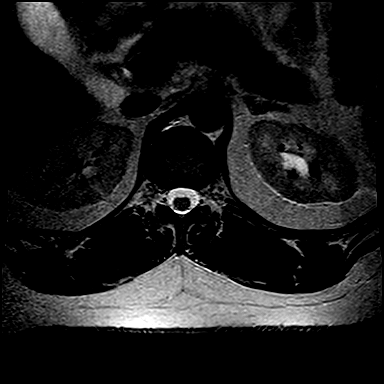

[31 of 48 positions shown; findings below may reference images not displayed]

FINDINGS: No acute bony lesions of lumbar vertebrae are seen. Conus terminates at L2 level.

At L1-2 level, no focal disc lesions are seen.  Facet arthropathy causing minimal compromise of both lateral recess.

At L2-3 level, severe degenerative disc disease with bulging annulus and significant facet arthropathy are noted causing significant compromise of both lateral recess and moderately significant compromise of thecal sac with AP diameter in the midline measuring 8.4 mm. 

At L3-4 level, bilateral facet arthropathy is causing moderately significant compromise of lateral recess and thecal sac with AP diameter in the midline measuring 7.3 mm.  Degenerative disc disease with bulging annulus and annulus tear is noted in the left lateral aspect.

At L4-5 level, significant bilateral facet arthropathy and degenerative disc disease with bulging annulus is noted.  Annulus tear is noted in the posterior left lateral aspect at the level of the foramen. Significant biforaminal narrowing is noted along with moderately significant compromise of thecal sac with AP diameter in the midline measuring 7.7 mm.

At L5-S1 level, degenerative disc disease and facet arthropathy are causing significant biforaminal stenosis, right more than the left.

Paravertebral soft tissues are unremarkable except for suggestion of hepatomegaly and mild splenomegaly.
IMPRESSION: 1. No focal bone changes of lumbar vertebrae.

2. Significant multilevel degenerative disc changes and facet arthropathy as described above in detail at each level.

3. Hepatomegaly and mild splenomegaly.  

Electronically Signed by LABUNSKI, ANNAGRETE at [id number] [DATE]

## 2023-08-23 ENCOUNTER — Ambulatory Visit (INDEPENDENT_AMBULATORY_CARE_PROVIDER_SITE_OTHER): Payer: Self-pay

## 2023-08-23 ENCOUNTER — Ambulatory Visit (INDEPENDENT_AMBULATORY_CARE_PROVIDER_SITE_OTHER): Payer: Self-pay | Admitting: NURSE PRACTITIONER

## 2023-10-20 ENCOUNTER — Other Ambulatory Visit: Payer: Self-pay

## 2023-10-22 ENCOUNTER — Other Ambulatory Visit: Payer: Self-pay

## 2023-10-26 ENCOUNTER — Encounter (INDEPENDENT_AMBULATORY_CARE_PROVIDER_SITE_OTHER): Payer: Self-pay | Admitting: Neurological Surgery

## 2023-10-26 ENCOUNTER — Ambulatory Visit: Payer: Medicaid Other | Attending: PHYSICIAN ASSISTANT | Admitting: PHYSICIAN ASSISTANT

## 2023-10-26 ENCOUNTER — Other Ambulatory Visit: Payer: Self-pay

## 2023-10-26 VITALS — BP 151/89 | HR 86 | Ht 65.0 in | Wt 212.0 lb

## 2023-10-26 DIAGNOSIS — M48061 Spinal stenosis, lumbar region without neurogenic claudication: Secondary | ICD-10-CM | POA: Insufficient documentation

## 2023-10-26 DIAGNOSIS — R251 Tremor, unspecified: Secondary | ICD-10-CM | POA: Insufficient documentation

## 2023-10-26 DIAGNOSIS — M4807 Spinal stenosis, lumbosacral region: Secondary | ICD-10-CM | POA: Insufficient documentation

## 2023-10-26 DIAGNOSIS — R252 Cramp and spasm: Secondary | ICD-10-CM | POA: Insufficient documentation

## 2023-10-26 DIAGNOSIS — M5442 Lumbago with sciatica, left side: Secondary | ICD-10-CM | POA: Insufficient documentation

## 2023-10-26 DIAGNOSIS — M47817 Spondylosis without myelopathy or radiculopathy, lumbosacral region: Secondary | ICD-10-CM | POA: Insufficient documentation

## 2023-10-26 DIAGNOSIS — R261 Paralytic gait: Secondary | ICD-10-CM | POA: Insufficient documentation

## 2023-10-26 DIAGNOSIS — R2689 Other abnormalities of gait and mobility: Secondary | ICD-10-CM | POA: Insufficient documentation

## 2023-10-26 DIAGNOSIS — M5416 Radiculopathy, lumbar region: Secondary | ICD-10-CM | POA: Insufficient documentation

## 2023-10-26 NOTE — H&P (Signed)
 NEUROSURGERY, MEDICAL OFFICE [facility]  [address]  [place] [address]  Operated by [facility]  History & Physical    Name: AAYLA MARROCCO MRN:  [id number]   Date: 10/26/2023 Age: 48 y.o.       Subjective:     Patient ID:   BENNETTA RUDDEN is an 48 y.o. female.      Chief Complaint:      Chief Complaint   Patient presents with    New Patient     Patient is a 48 year old female here today for left sided lumbago with sciatica. Patient rates her pain a 10/10. Patient denies previous spine surgeries. Patient's imaging was completed at [facility] and had an MRI at [facility] and is present with that disc. Patient denies hardware in the body.        HPI    The patient is a pleasant 48 year old female who presents to the clinic for evaluation of low back pain and bilateral leg pain.  Patient endorses a history of the over the past 10 years or more but states it has significantly worsened over the past 5 or 6 month.  She does report pain that radiates from the low back and into the lateral portion of both legs in his of the dorsal surface of the feet.  She reports that her right leg is worse than her left sometimes.  Her legs are more painful than her back.  She does report that activity makes the symptoms worsened she rates her pain 10/10 on a daily basis with activity.  She has had physical therapy, been evaluated by chiropractor and had spinal injections by interventional pain management several years ago without significant relief.  Does report some tremors in the lower extremities, stating she has uncontrollable jerking type sensations of her legs that happens randomly throughout the day.  She also has a short shuffling gait that she reports.  She reports that her legs will give out cause her to fall.  Denies any bowel or bladder changes acutely.    Past Medical History:   Diagnosis Date    Anxiety     Bipolar disorder, unspecified (CMS HCC)     Depression     Diabetes  mellitus, type 2 (CMS HCC)     Esophageal reflux     High triglycerides     HTN (hypertension)     Hypercholesterolemia     Lumbar back pain     2 herniated discs    Sleep apnea     has CPAP      Past Surgical History:   Procedure Laterality Date    ESOPHAGOGASTRODUODENOSCOPY      HX APPENDECTOMY      HX COLONOSCOPY      HX HYSTERECTOMY      HX TUBAL LIGATION        Social History     Tobacco Use    Smoking status: Former     Types: Cigarettes    Smokeless tobacco: Never   Vaping Use    Vaping status: Never Used   Substance Use Topics    Alcohol use: Not Currently    Drug use: Never      Family Medical History:       Problem Relation (Age of Onset)    Breast Cancer Maternal Aunt (23), Paternal Grandmother    Coronary Artery Disease Other    Elevated Triglycerides Mother    Endometrial cancer Other  Heart Disease Mother, Father    Hypertension (High Blood Pressure) Mother    Lung Cancer Other    Mental illness Other    No Known Problems Sister, Brother, Maternal Uncle, Paternal Aunt, Paternal Uncle, Maternal Grandmother, Maternal Grandfather, Paternal Grandfather, Daughter, Son    Stroke Mother, Father           Current Outpatient Medications   Medication Sig    amLODIPine (NORVASC) 10 mg Oral Tablet Take 1 Tablet (10 mg total) by mouth Once a day    atorvastatin (LIPITOR) 80 mg Oral Tablet Take 1 Tablet (80 mg total) by mouth Once a day    buPROPion (WELLBUTRIN SR) 150 mg Oral tablet sustained-release 12 hr Take 1 Tablet (150 mg total) by mouth Every morning    busPIRone (BUSPAR) 15 mg Oral Tablet Take 1 Tablet (15 mg total) by mouth Three times a day    diclofenac sodium (VOLTAREN) 75 mg Oral Tablet, Delayed Release (E.C.) Take 1 Tablet (75 mg total) by mouth Three times a day Dose verified with patient    ergocalciferol, vitamin D2, (DRISDOL) 1,250 mcg (50,000 unit) Oral Capsule Take 1 Capsule (50,000 Units total) by mouth Every 7 days    furosemide (LASIX) 40 mg Oral Tablet Take 1 Tablet (40 mg total) by mouth  Once a day    hydrOXYzine HCL (ATARAX) 50 mg Oral Tablet Take 1 Tablet (50 mg total) by mouth Four times a day    Ibuprofen (MOTRIN) 200 mg Oral Tablet Take 4 Tablets (800 mg total) by mouth Three times a day as needed    lisinopriL (PRINIVIL) 40 mg Oral Tablet Take 1 Tablet (40 mg total) by mouth Once a day    LORazepam (ATIVAN) 1 mg Oral Tablet Take 1 Tablet (1 mg total) by mouth Twice per day as needed for Anxiety    metoprolol succinate (TOPROL-XL) 25 mg Oral Tablet Sustained Release 24 hr Take 1 Tablet (25 mg total) by mouth Once a day    Mirtazapine (REMERON) 45 mg Oral Tablet Take 1 Tablet (45 mg total) by mouth Every night    omeprazole (PRILOSEC) 40 mg Oral Capsule, Delayed Release(E.C.) Take 1 Capsule (40 mg total) by mouth Once a day    ondansetron (ZOFRAN) 4 mg Oral Tablet Take 1 Tablet (4 mg total) by mouth Once per day as needed    OXcarbazepine (TRILEPTAL) 600 mg Oral Tablet Take 1 Tablet (600 mg total) by mouth    polyethylene glycol 3350 (MIRALAX ORAL) Take by mouth    QUEtiapine (SEROQUEL) 50 mg Oral Tablet Take 1 Tablet (50 mg total) by mouth Four times a day Verified dose with patient       Allergies   Allergen Reactions    Steroids [Steroid] Mental Status Effect    Amoxicillin Nausea/ Vomiting    Iron Nausea/ Vomiting     Iron pills never tried iv iron        Review of Systems:  Review of systems pertinent negatives and postives as discussed in HPI.     Objective:   Neurological Exam  Mental Status  Awake, alert and oriented to person, place and time. Oriented to person, place and time. Recent and remote memory are intact. Speech is normal. Language is fluent with no aphasia. Attention and concentration are normal.    Cranial Nerves  CN II: Visual acuity is normal. Visual fields full to confrontation.  CN III, IV, VI: Extraocular movements intact bilaterally. Normal lids and orbits bilaterally. Pupils  equal round and reactive to light bilaterally.  CN V: Facial sensation is normal.  CN VII: Full  and symmetric facial movement.  CN VIII: Hearing is normal.  CN IX, X: Palate elevates symmetrically. Normal gag reflex.  CN XI: Shoulder shrug strength is normal.  CN XII: Tongue midline without atrophy or fasciculations.    Motor  Normal muscle bulk throughout. No fasciculations present. Normal muscle tone. Strength is 5/5 in all four extremities except as noted.    Patient does have uncontrollable jerking type tremors involving the lower extremities throughout the entirety of my evaluation..    Sensory  Sensation is intact to light touch, pinprick, vibration and proprioception in all four extremities.    Reflexes  Deep tendon reflexes are 2+ and symmetric in all four extremities.    Right pathological reflexes: Hoffmann's absent. Ankle clonus absent.  Left pathological reflexes: Hoffmann's absent. Ankle clonus absent.    Coordination    Finger-to-nose, rapid alternating movements and heel-to-shin normal bilaterally without dysmetria.    Gait  Casual gait: Spastic gait.      Data reviewed:  Imaging:  I have reviewed an MRI of the lumbar spine performed at [facility].  This shows multilevel degenerative changes throughout the lumbar spine with degenerative disc disease and facet hypertrophy at multiple levels.  Most significant stenosis as identified bilaterally at the L5 foramina which I would say is severe.  This is secondary to disc collapse and degenerative disc disease with bulging.    Assessment & Plan:       ICD-10-CM    1. Lumbar radiculopathy  M54.16 Refer to Physical Therapy-External     MRI SPINE THORACIC WO CONTRAST     CANCELED: MRI SPINE THORACIC WO CONTRAST      2. Spasticity  R25.2 Referral to Provider - External        The patient does have symptoms consistent with a bilateral L5 radiculopathy.  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  I do feel she would benefit from physical therapy  evaluation and we gave an order for this.  I also feel she would benefit from bilateral L5 transforaminal epidural steroid injections which our office will arrange in the near future.      However, I feel the patient has some abnormal neurologic issues going on.  She does have uncontrollable tremors or jerking type sensations involving the lower extremities.  She does not have any abnormal reflexes on my exam, however she is prediabetic.  Her symptoms to me are concerning for spastic gait and myoclonic jerking.  I would like to obtain an MRI of the thoracic spine to evaluate for compression that could cause these symptoms.  Furthermore, we will refer her to Dr. Andee Poles, neurology in [facility] for evaluation of this neurologic condition.    We will see her back after her injection.    On the day of the encounter, a total of  40  minutes was spent on this patient encounter including review of historical information, examination, documentation and post-visit activities. The time documented excludes procedural time.   Sunny Schlein, PA-C    This note was partially generated using MModal Fluency Direct system, and there may be some incorrect words, spellings, and punctuation that were not noted in checking the note before saving.

## 2023-10-27 ENCOUNTER — Other Ambulatory Visit (HOSPITAL_COMMUNITY): Payer: Self-pay

## 2023-11-02 ENCOUNTER — Ambulatory Visit
Admission: RE | Admit: 2023-11-02 | Discharge: 2023-11-02 | Disposition: A | Discharge status: Home / Self Care | Payer: Self-pay | Source: Ambulatory Visit | Attending: PHYSICIAN ASSISTANT | Admitting: PHYSICIAN ASSISTANT

## 2023-11-02 ENCOUNTER — Other Ambulatory Visit: Payer: Self-pay

## 2023-11-02 DIAGNOSIS — M5416 Radiculopathy, lumbar region: Secondary | ICD-10-CM | POA: Insufficient documentation

## 2023-11-10 ENCOUNTER — Ambulatory Visit (HOSPITAL_COMMUNITY): Payer: Self-pay

## 2023-11-10 ENCOUNTER — Other Ambulatory Visit (HOSPITAL_COMMUNITY): Payer: Self-pay | Admitting: PHYSICIAN ASSISTANT

## 2023-11-10 DIAGNOSIS — M5416 Radiculopathy, lumbar region: Secondary | ICD-10-CM

## 2023-11-11 ENCOUNTER — Ambulatory Visit (HOSPITAL_COMMUNITY)
Admission: RE | Admit: 2023-11-11 | Discharge: 2023-11-11 | Disposition: A | Discharge status: Still In Hospital | Payer: Self-pay | Source: Ambulatory Visit | Attending: PHYSICIAN ASSISTANT | Admitting: PHYSICIAN ASSISTANT

## 2023-11-11 ENCOUNTER — Other Ambulatory Visit: Payer: Self-pay

## 2023-11-11 DIAGNOSIS — M5416 Radiculopathy, lumbar region: Secondary | ICD-10-CM | POA: Insufficient documentation

## 2023-11-11 DIAGNOSIS — M5410 Radiculopathy, site unspecified: Secondary | ICD-10-CM | POA: Insufficient documentation

## 2023-11-11 NOTE — PT Evaluation (Signed)
 [facility]  Outpatient Physical Therapy  [address]  [place], [address]  [phone]  (Fax) [phone]      Physical Therapy Lumbar Evaluation    Date: 11/11/2023  Patient's Name: Miranda Young  Date of Birth: 1976/07/02  Physical Therapy Evaluation      Evaluating Physical Therapist: Doy Mince PT, DPT   PT diagnosis/Reason for Referral: Lumbar radiculopathy   Next Scheduled Physician Appointment: PRN,   Allergies/Contraindications: none stated, fall risk             SUBJECTIVE  Date of onset: Patient reports chronic history of lumbar radiculopathy but notes symptoms have progressed in the last 5-6 months.    Mechanism of injury: Patient reports insidious onset and progression of symptoms.     Current Presentation: Patient reports at onset symptoms where only present in RLE. Patient notes symptoms are now BLE, LLE> RLE. Patient reports severe low back pain with bilateral radicular symptoms to the tops of her feet. Patient reports she has been told she is not a surgical candidate. Patient reports tens unit makes her worse. Patient reports jerking in BLE which has started in the last 5-6 months. Patient reports BLE gives away on her. Patient reports she generally gets a sharp nerve pain before legs give away and she is able to grab something to prevent falls. Patient reports she is unable to go down steps due to pain. Patient reports constant N/T in BUE in same pattern as her pain. Patient reports she has lost a significant amount of strength in her legs in the last month and a half. Patient also notes increased frequency of restless leg syndromes.    PLOF: Chronic lumbar radiculopathy with progressively worsening symptoms.     Previous episodes/treatments: Patient reports previous PT, chiropractic care, injections and nerve blocks without any relief of symptoms.     Past Medical History:   Past Medical History:   Diagnosis Date    Anxiety     Bipolar disorder,  unspecified (CMS HCC)     Depression     Diabetes mellitus, type 2 (CMS HCC)     Esophageal reflux     High triglycerides     HTN (hypertension)     Hypercholesterolemia     Lumbar back pain     2 herniated discs    Sleep apnea     has CPAP         Past Surgical History:   Past Surgical History:   Procedure Laterality Date    ESOPHAGOGASTRODUODENOSCOPY      HX APPENDECTOMY      HX COLONOSCOPY      HX HYSTERECTOMY      HX TUBAL LIGATION         Medications for this problem: pain medication and anti-inflammatory    Diagnostic tests:   X-ray LS 07/16/23   IMPRESSION:  MILD DEGENERATIVE CHANGES OF THE LUMBAR SPINE.    MRI thoracic spine 11/02/23  IMPRESSION:  SMALL DISC BULGE WITH MILD SPINAL CANAL NARROWING AT T5-6    Patient also notes lumbar MRI at [facility] radiology in November 24.     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     Pain location: Patient reports lower central LBP and BLE radicular symptoms                Pain description: SHARP and Jolt of pain  Pain frequency:  CONTINUOUS    Pain rating: Now 6-7/10   Best 5/10    Worst 10/10    Radiculopathy: BLE     Pain increases with: ADLs, ACTIVITY, EXERCISE, STAND, SIT, WALK, STAIR CLIMBING, and Turning over in bed           decreases with :  Short periods of sitting on a soft chair    Sensation: Patient reports BLE N/T     Weakness: Patient reports BLE weakness    Sleep affected: Patient reports she is not able to sleep at all due to her pain. Patient reports she is unable to find a position of comfort.     Bowel/bladder problems: Patient denies    Subjective Functional Reports:    Sitting: LIMITED    Standing: LIMITED    Walking: LIMITED    Lifting: LIMITED          OBJECTIVE    Patient-Specific Functional Score:    Problem Score   1. Taking a shower  4   2. Going down steps 2   3. Sleeping 0   Total 3   Total score = sum of the activity scores/number of activities    Minimal detectable change  (90% CI) for avg score = 2 points    Minimal detectable change (90% CI) for single activity score = 3 points         AROM   right left   Flexion 20 NT   Extension 10 NT   Sidebend 15 10   Rotation NT NT   ROM comments: Patient notes increased LBP with flexion, increased LLE pain with extension and LSB.     Strength (Manual Muscle Testing per [employer] Muscle Grading system)     right left   Hip flexion (L1,2) 4-/5 4-/5   Hip extension (L5,S1,2) Unable  Unable    Knee flexion (S1) 4-/5 4-/5   Knee extension (L2-4) 4-/5 4-/5   Ankle DF (L4-5) 4/5 Unable to perform   Ankle PF (S1,2) 4/5 Unable to perform   Strength comments:    Palpation: Patient unable tolerated palpation due to high pain presentation.     Observation: jerking/clonus of legs observed after motion testing.     Joint mobility:    Posture:  Forward trunk lean    Gait:  Antalgic, shuffling gait pattern. LOB     Reflexes    Patellar 2+ B   Achilles Absent B   LE Clonus  ( - ) BLE   Hoffman's  ( - ) BLE      Special tests:   ( + ) SLR < 70 degrees BLE   ( + ) Sign of the buttock BLE   ( + ) piriformis test LLE   (  + ) traction test RLE ( relief of LBP and RLE  symptoms with LAHD )  ( - ) Traction test LLE  ( increased pain in Low back and LLE. )       Treatment provided:REVIEW OF POC AND GOALS WITH PATIENT, ALL QUESTIONS ANSWERED, PATIENT EDUCATION, and THERAPEUTIC EXERCISE     HEP Access Code: [email or web address]  URL: [email or web address]  Date: 11/11/2023  Prepared by: Doy Mince    Exercises  - Supine Lower Trunk Rotation  - 1 x daily - 7 x weekly - 2 sets - 10 reps  - Seated Piriformis Stretch  - 1 x daily - 7 x weekly - 1 sets - 3-5 reps - 10 seconds hold  -  Hooklying Single Knee to Chest Stretch with Towel  - 1 x daily - 7 x weekly - 1 sets - 3-5 reps - 10 seconds hold    EXERCISE/ACTIVITY NAME REPETITIONS RESISTANCE COMPLETED THIS DOS   HEP Review    Y   Manual Therapy:   SAHD R   SAHD L   LAHD R    Y                   ASSESSMENT    Impression: Patient  is a 48 year old female referred to PT secondary to lumbar radiculopathy. Static assessment revealed: forward trunk lean, loss of lumbar lordosis. Dynamic assessment revealed: decreased pain free LS AROM in all planes, decreased BLE gross muscle strength, pain with all special testing, and inconsistent response to manual unilateral lumbar distraction. Will continue PT on trial basis, prognosis guard at this time. Patient will benefit from skilled PT to reduce subjective complaints, improve lumbar AROM, centralize radicular symptoms, and improve BLE gross muscle strength in order to optimize functional mobility and maximize functional independence.      Rehab potential: GUARDED      Goals:     Short-Term Goals: 2 Weeks     - Patient will demonstrate tolerance to PT intervention    - Patient will be independent with progressive HEP to maximize gains from PT.     - Patient will report max 7 LBP to aid in participation in PT.     - Patient will report reduced radicular symptoms by 5% to aid in house ADLs.     - Patient will demonstrate tolerance to gentle lumbar traction          Long-Term Goals: 4 Weeks     - Patient will report improved tolerance to sleeping     - Patient will demonstrate improved tolerance to stair navigation     - Patient will demonstrate improved functional ability via improved Patient Specific Functional Score to at least 5.     - Patient will report sleep not disrupted by back pain to aid in participation of functional ADLs during the day.     PLAN  Patient will attend 2 times per week x 4 weeks. Therapy may include, but is not limited to THERAPEUTIC EXERCISES, MYOFASCIAL/JOINT MOBILIZATION, TRANSFER/GAIT TRAINING, HOME INSTRUCTIONS, HEAT/COLD, and MECHANICAL TRACTION    Plan for next visit: assess tolerance to HEP, progress per flowsheet.         Evaluation complexity:   Personal factors impacting POC: FREQUENT OR CHRONIC PAIN and PRE-EXISTING FUNCTIONAL LIMITATIONS   Co-morbidities impacting  POC:  None  Complexity of physical exam: INCLUDING MUSCULOSKELETAL SYSTEM (POSTURE, ROM, STRENGTH, HEIGHT/WEIGHT), INCLUDING NEUROMUSCULAR EXAM (BALANCE, GAIT, LOCOMOTION, MOBILITY), and INCLUDING ACTIVITY/MOBILITY RESTRICTIONS   Clinical Presentation: STABLE   Evaluation Complexity: LOW-HISTORY 0, EXAMINATION 1-2, STABLE PRESENTATION        Total Session Time 60, Timed code minutes 15, and Untimed code minutes 45        Intervention minutes: EVALUATION 45 minutes and THERAPEUTIC EXERCISE 15 minutes    Doy Mince, PT  11/11/2023, 09:30

## 2023-11-17 ENCOUNTER — Ambulatory Visit (HOSPITAL_COMMUNITY)
Admission: RE | Admit: 2023-11-17 | Discharge: 2023-11-17 | Disposition: A | Discharge status: Still In Hospital | Payer: Self-pay | Source: Ambulatory Visit

## 2023-11-17 ENCOUNTER — Other Ambulatory Visit: Payer: Self-pay

## 2023-11-17 NOTE — PT Treatment (Signed)
 [facility]  Outpatient Physical Therapy  [address]  [place], [address]  [phone]  (Fax) [phone]    Physical Therapy Treatment Note    Date: 11/17/2023  Patient's Name: Miranda Young  Date of Birth: 08/25/1976  Physical Therapy Visit            Visit #/POC:2/8  Authorization:20 cal year  POC Signed?: no  POC Ends: 4/10  Order Ends: Open  Next Progress Note Due: Visit 8       Evaluating Physical Therapist: Doy Mince PT, DPT   PT diagnosis/Reason for Referral: Lumbar radiculopathy   Next Scheduled Physician Appointment: PRN,   Allergies/Contraindications: none stated, fall risk          Subjective: [name] Patient reports good tolerance to HEP but notes difficulty with piriformis stretch. Patient reports significant fatigue following IE. Patient does report increased pain with coughing and blowing her nose.     Objective: Activities per flow sheet    Measured ROM: Not assessed this date  EXERCISE/ACTIVITY NAME REPETITIONS RESISTANCE COMPLETED THIS DOS   HEP Review    Y   Manual Therapy:  SAHD R   SAHD L   LAHD R    Y   LTR 10x  Y   SKTC 5x 10 seconds  Y   Piriformis supine with strap 5x 10 seconds  Y   Lo-back Trax 10x10 seconds  Y                           Assessment: Patient demos fair tolerance to exercise program this date. Piriformis stretch modified to supine with strap with fair tolerance but notes difficulty with RLE.     Plan:  Continue to monitor response to treatment and HEP.     Total Session Time 40 and Timed code minutes 40  THERAPEUTIC EXERCISE 40 minutes      Doy Mince, PT  11/17/2023, 09:48

## 2023-11-24 ENCOUNTER — Ambulatory Visit
Admission: RE | Admit: 2023-11-24 | Discharge: 2023-11-24 | Disposition: A | Discharge status: Still In Hospital | Payer: Self-pay | Source: Ambulatory Visit | Attending: PHYSICIAN ASSISTANT | Admitting: PHYSICIAN ASSISTANT

## 2023-11-24 ENCOUNTER — Other Ambulatory Visit: Payer: Self-pay

## 2023-11-24 NOTE — PT Treatment (Signed)
 [facility]  Outpatient Physical Therapy  [address]  [place], [address]  [phone]  (Fax) [phone]    Physical Therapy Treatment Note    Date: 11/24/2023  Patient's Name: Miranda Young  Date of Birth: 01/08/1976  Physical Therapy Visit            Visit #/POC:3/8  Authorization:20 cal year  POC Signed?: no  POC Ends: 4/10  Order Ends: Open  Next Progress Note Due: Visit 8         Evaluating Physical Therapist: Floy Hutch PT, DPT   PT diagnosis/Reason for Referral: Lumbar radiculopathy   Next Scheduled Physician Appointment: PRN,   Allergies/Contraindications: none stated, fall risk        Subjective: Patient reports she was weak and tired following last PT session but denies increased pain. Patient reports she has started to stagger in the last week or two weeks. [name] Patient reports improved tolerance to piriformis stretch following modification last session. Patient denies any relief following last PT session.      Objective: Activities per flow sheet     Measured ROM: Not assessed this date  EXERCISE/ACTIVITY NAME REPETITIONS RESISTANCE COMPLETED THIS DOS   HEP Review      Y   Manual Therapy:  SAHD R   SAHD L   LAHD R      Y   LTR 10x   Y   SKTC 5x 10 seconds   Y   Piriformis supine with strap 5x 10 seconds   Y   Lo-back Trax 10x10 seconds   Y        Assessment: Patient continues to demo fair tolerance to exercise program. Patient reports transient relief of symptoms with manual and self traction. Patient reports greater relief of symptoms with manual traction. Patient continues to have significant muscle jerking throughout session.     Plan:  Continue to monitor response to treatment and HEP.     Total Session Time 32 and Timed code minutes 32  THERAPEUTIC EXERCISE 32 minutes      Floy Hutch, PT  11/24/2023, 14:38

## 2023-11-26 ENCOUNTER — Ambulatory Visit (HOSPITAL_COMMUNITY): Payer: Self-pay

## 2023-11-30 ENCOUNTER — Ambulatory Visit (HOSPITAL_COMMUNITY)
Admission: RE | Admit: 2023-11-30 | Discharge: 2023-11-30 | Disposition: A | Discharge status: Still In Hospital | Payer: Self-pay | Source: Ambulatory Visit

## 2023-11-30 ENCOUNTER — Other Ambulatory Visit: Payer: Self-pay

## 2023-11-30 DIAGNOSIS — M5416 Radiculopathy, lumbar region: Secondary | ICD-10-CM | POA: Insufficient documentation

## 2023-11-30 NOTE — Progress Notes (Signed)
 [facility]  Outpatient Physical Therapy  [address]  [place], [address]  [phone]  (Fax) [phone]    Physical Therapy Progress Note    Date: 11/30/2023  Patient's Name: Miranda Young  Date of Birth: 28-Jan-1976  Physical Therapy Progress Note     Visit #/POC:4/8  Authorization:20 cal year  POC Signed?: no  POC Ends: 4/10  Order Ends: Open  Next Progress Note Due: Visit 8     Evaluating Physical Therapist: Floy Hutch PT, DPT   PT diagnosis/Reason for Referral: Lumbar radiculopathy   Next Scheduled Physician Appointment: PRN,   Allergies/Contraindications: none stated, fall risk        Subjective: Patient reports she feels "okay" prior to today's session. Patient reports increased soreness following last treatment session. Patient notes this soreness was in the right side of her back and BLE. Patient reports improved tolerance to HEP. Patient reports 40% improvement since [facility]. Patient notes no improvement in pain with PT thus far but does note she is able to move better. Patient reports improved tolerance to sleeping and turning over in bed.      Objective: Activities per flow sheet    Patient-Specific Functional Score:  Problem Score IE  4/1    1. Taking a shower  4 7   2. Going down steps 2 3   3. Sleeping 0 6   Total 3 5.33   Total score = sum of the activity scores/number of activities    Minimal detectable change (90% CI) for avg score = 2 points    Minimal detectable change (90% CI) for single activity score = 3 points         Measured ROM:  AROM    Right IE  4/1 Left IE  4/1   Flexion 20 65 NA NA   Extension 10 20 NA NA   Sidebend 15 21 10 25    Rotation NT NT NT NT   ROM comments: Patient notes increased BLE pain with flexion, increased BLE pain with extension, increased LLE pain with LSB, and increased left side LBP with RSB     EXERCISE/ACTIVITY NAME REPETITIONS RESISTANCE COMPLETED THIS DOS   HEP Review      Y   Manual Therapy:  SAHD R   SAHD L   LAHD R      Y    LTR 15x   Y   SKTC 5x 10 seconds   Y   Piriformis supine with strap 5x 10 seconds   Y   Lo-back Trax 10x10 seconds   Y         Assessment: Patient demos fair tolerance to exercise program this date. Patient reports improved tolerance to functional activity and improved lumbar ROM. Patient notes these improvements have no correlated with reduction in symptoms to date. Less muscle jerking noted this date compared to previous session. Progression held to patient report of increased soreness, will attempt to progress next session as tolerated.    Goals:     Short-Term Goals: 2 Weeks     - Patient will demonstrate tolerance to PT intervention ( PROGRESSING 11/30/23 )     - Patient will be independent with progressive HEP to maximize gains from PT.     - Patient will report max 7 LBP to aid in participation in PT.     - Patient will report reduced radicular symptoms by 5% to aid in house ADLs.     - Patient will  demonstrate tolerance to gentle lumbar traction ( MET 11/30/23 )           Long-Term Goals: 4 Weeks     - Patient will report improved tolerance to sleeping     - Patient will demonstrate improved tolerance to stair navigation     - Patient will demonstrate improved functional ability via improved Patient Specific Functional Score to at least 5.     - Patient will report sleep not disrupted by back pain to aid in participation of functional ADLs during the day.      Plan:  Continue to monitor response to treatment and HEP.     Total Session Time 40 and Timed code minutes 40  THERAPEUTIC EXERCISE 40 minutes      Floy Hutch, PT  11/30/2023, 08:00

## 2023-12-01 ENCOUNTER — Encounter (INDEPENDENT_AMBULATORY_CARE_PROVIDER_SITE_OTHER): Payer: Self-pay | Admitting: PHYSICIAN ASSISTANT

## 2023-12-02 ENCOUNTER — Other Ambulatory Visit: Payer: Self-pay

## 2023-12-02 ENCOUNTER — Ambulatory Visit (HOSPITAL_COMMUNITY)
Admission: RE | Admit: 2023-12-02 | Discharge: 2023-12-02 | Disposition: A | Discharge status: Still In Hospital | Payer: Self-pay | Source: Ambulatory Visit | Attending: PHYSICIAN ASSISTANT

## 2023-12-02 NOTE — PT Treatment (Signed)
 [facility]  Outpatient Physical Therapy  [address]  [place], [address]  [phone]  (Fax) [phone]    Physical Therapy Treatment Note    Date: 12/02/2023  Patient's Name: Miranda Young  Date of Birth: [birth year]  Physical Therapy Visit            Visit #/POC:5/8  Authorization:20 cal year  POC Signed?: no  POC Ends: 4/10  Order Ends: Open  Next Progress Note Due: Visit 8      Evaluating Physical Therapist: Floy Hutch PT, DPT   PT diagnosis/Reason for Referral: Lumbar radiculopathy   Next Scheduled Physician Appointment: PRN,   Allergies/Contraindications: none stated, fall risk     Subjective: Patient reports pain as a 4/10 prior to today's session. Patient reports she was sore following last session but attributes this to busy day at work with large amount of walking and not PT intervention. Patient reports she contacted Dr. Jerold Moor office and is awaiting return call.      Objective: Activities per flow sheet     EXERCISE/ACTIVITY NAME REPETITIONS RESISTANCE COMPLETED THIS DOS   HEP Review      Y   Manual Therapy:  SAHD R   SAHD L   LAHD R      Y   LTR 15x   Y   Bridge  15x  Y   PPT  12x TC/VC  Y   PPT with march   N   SKTC 5x 10 seconds   Y   Piriformis supine with strap 5x 10 seconds   Y   Lo-back Trax 10x10 seconds   N      Assessment: Patient demos fair tolerance to exercise program this date. Patient with increased discomfort/difficulty with LTR today compared to previous sessions. Patient with difficulty initiating PPT.       Goals:     Short-Term Goals: 2 Weeks     - Patient will demonstrate tolerance to PT intervention ( PROGRESSING 11/30/23 )     - Patient will be independent with progressive HEP to maximize gains from PT.     - Patient will report max 7 LBP to aid in participation in PT.     - Patient will report reduced radicular symptoms by 5% to aid in house ADLs.     - Patient will demonstrate tolerance to gentle lumbar traction ( MET 11/30/23 )            Long-Term Goals: 4 Weeks     - Patient will report improved tolerance to sleeping     - Patient will demonstrate improved tolerance to stair navigation     - Patient will demonstrate improved functional ability via improved Patient Specific Functional Score to at least 5.     - Patient will report sleep not disrupted by back pain to aid in participation of functional ADLs during the day.        Plan:  Continue to monitor response to treatment and HEP.     Total Session Time 40 and Timed code minutes 40  THERAPEUTIC EXERCISE 40 minutes      Floy Hutch, PT  12/02/2023, 10:35

## 2023-12-07 ENCOUNTER — Telehealth (INDEPENDENT_AMBULATORY_CARE_PROVIDER_SITE_OTHER): Payer: Self-pay | Admitting: PHYSICIAN ASSISTANT

## 2023-12-07 ENCOUNTER — Ambulatory Visit (HOSPITAL_COMMUNITY)
Admission: RE | Admit: 2023-12-07 | Discharge: 2023-12-07 | Disposition: A | Discharge status: Still In Hospital | Payer: Self-pay | Source: Ambulatory Visit | Attending: PHYSICIAN ASSISTANT

## 2023-12-07 ENCOUNTER — Other Ambulatory Visit: Payer: Self-pay

## 2023-12-07 ENCOUNTER — Other Ambulatory Visit (INDEPENDENT_AMBULATORY_CARE_PROVIDER_SITE_OTHER): Payer: Self-pay | Admitting: PHYSICIAN ASSISTANT

## 2023-12-07 NOTE — Telephone Encounter (Signed)
 Referral sent to Dr. Dyke Brackett neurology in [place] for DX: uncontrollable tremors, jerking type sensations involving lower extremities to [facility] [phone] if patient calls and needs to make appt there contact number is [phone].

## 2023-12-07 NOTE — PT Treatment (Signed)
[facility]  Outpatient Physical Therapy  [address]  [place], [address]  [phone]  (Fax) [phone]    Physical Therapy Treatment Note    Date: 12/07/2023  Patient's Name: Miranda Young  Date of Birth: Jan 15, 1976  Physical Therapy Visit        Visit #/POC:6/8  Authorization:20 cal year  POC Signed?: no  POC Ends: 4/10  Order Ends: Open  Next Progress Note Due: Visit 8      Evaluating Physical Therapist: Doy Mince PT, DPT   PT diagnosis/Reason for Referral: Lumbar radiculopathy   Next Scheduled Physician Appointment: PRN,   Allergies/Contraindications: none stated, fall risk     Subjective: Patient reports pain as a 7/10 prior to today's session. Patient reports waking up with worst pain today. Patient denies increased pain/soreness following progression last session. Patient reports Dr. Lennon Alstrom office told patient not to schedule FU until after D/C from PT. Patient notes onset of "numb spots" on Saturday in bilateral posterior lateral lower leg L > R.    Objective: Activities per flow sheet      EXERCISE/ACTIVITY NAME REPETITIONS RESISTANCE COMPLETED THIS DOS   HEP Review      N    Manual Therapy:  SAHD R   SAHD L   LAHD R      Y   LTR 15x   Y   Bridge  3x5   Y   PPT  15x  3x 10 seconds TC/VC  Y  Y   PPT with march     N   SKTC 5x 10 seconds   Y   Piriformis supine with strap 5x 10 seconds   Y   Lo-back Trax 10x10 seconds   N      Assessment: Patient demos fair tolerance to exercise program but notes increased pain/difficulty throughout. Patient does note reduced pain post session and reports pain as a 4/10. Patient demos improved performance with PPT but continues to require TC/VC to complete with proper form.         Goals:     Short-Term Goals: 2 Weeks     - Patient will demonstrate tolerance to PT intervention ( PROGRESSING 11/30/23 )     - Patient will be independent with progressive HEP to maximize gains from PT. ( MET 11/30/23 )     - Patient will report max 7 LBP  to aid in participation in PT. ( NOT MET 11/30/23 )     - Patient will report reduced radicular symptoms by 5% to aid in house ADLs. ( NOT MET 11/30/23 )     - Patient will demonstrate tolerance to gentle lumbar traction ( MET 11/30/23 )           Long-Term Goals: 4 Weeks     - Patient will report improved tolerance to sleeping     - Patient will demonstrate improved tolerance to stair navigation     - Patient will demonstrate improved functional ability via improved Patient Specific Functional Score to at least 5. ( MET 11/30/23 )     - Patient will report sleep not disrupted by back pain to aid in participation of functional ADLs during the day.         Plan:  Continue to monitor response to treatment and HEP.        Total Session Time 40 and Timed code minutes 40  THERAPEUTIC EXERCISE 40 minutes      Doy Mince, PT  12/07/2023, 10:32

## 2023-12-09 ENCOUNTER — Telehealth (INDEPENDENT_AMBULATORY_CARE_PROVIDER_SITE_OTHER): Payer: Self-pay | Admitting: Neurological Surgery

## 2023-12-09 ENCOUNTER — Ambulatory Visit
Admission: RE | Admit: 2023-12-09 | Discharge: 2023-12-09 | Disposition: A | Discharge status: Still In Hospital | Payer: Self-pay | Source: Ambulatory Visit | Attending: PHYSICIAN ASSISTANT | Admitting: PHYSICIAN ASSISTANT

## 2023-12-09 ENCOUNTER — Other Ambulatory Visit: Payer: Self-pay

## 2023-12-09 NOTE — PT Treatment (Addendum)
 [facility]  Outpatient Physical Therapy  [address]  [place], [address]  [phone]  (Fax) [phone]    Physical Therapy Discharge Note    Date: 12/09/2023  Patient's Name: Miranda Young  Date of Birth: Nov 30, 1975  Physical Therapy Discharge            Visit #/POC:7/8  Authorization:20 cal year  POC Signed?: no  POC Ends: 4/10  Order Ends: Open  Next Progress Note Due: Visit 8      Evaluating Physical Therapist: Doy Mince PT, DPT   PT diagnosis/Reason for Referral: Lumbar radiculopathy   Next Scheduled Physician Appointment: [facility],   Allergies/Contraindications: none stated, fall risk     Subjective: Patient reports pain as a 4/10 prior to today's session. Patient reports she is scheduled for injections in May. Patient continues to note numb spots in posterior lateral lower legs but notes they are not as bad as Tuesday's session. Patient reports she feels PT has improved overall mobility but denies any change to LBP or radicular symptoms at this time. Patient reports worst pain the last week as a 7/10.     Objective: Activities per flow sheet     Patient-Specific Functional Score:  Problem Score IE  4/1  4/10   1. Taking a shower  4 7 8    2. Going down steps 2 3 2    3. Sleeping 0 6 8   Total 3 5.33 6   Total score = sum of the activity scores/number of activities    Minimal detectable change (90% CI) for avg score = 2 points    Minimal detectable change (90% CI) for single activity score = 3 points            Measured ROM:  AROM    Right IE  4/1 4/10 Left IE  4/1 4/10   Flexion 20 65 95 NA NA NA   Extension 10 20 20  NA NA NA   Sidebend 15 25 25 10 21 20    Rotation NT NT NT NT NT NT     Strength (Manual Muscle Testing per [employer] Muscle Grading system)       Right IE  Right 12/09/23  Left IE  Left 12/09/23   Hip flexion (L1,2) 4-/5 4+/5 4-/5 4+/5   Hip extension (L5,S1,2) Unable  DNT Unable  DNT   Knee flexion (S1) 4-/5 4/5 4-/5 4+/5   Knee extension (L2-4) 4-/5 4+/5 4-/5  4+/5   Ankle DF (L4-5) 4/5 5/5 Unable to perform 5/5   Ankle PF (S1,2) 4/5 5/5 Unable to perform 5/5   Strength comments: Patient demos improved strength this date compared to baseline assessment. Patient continues to present with "muscle jerking" during MMT assessment R more prominent than left.      EXERCISE/ACTIVITY NAME REPETITIONS RESISTANCE COMPLETED THIS DOS   HEP Review      N    Manual Therapy:  SAHD R   SAHD L   LAHD R      Y   LTR 15x   N   Bridge  3x5   N   PPT  15x  3x 10 seconds TC/VC  N   PPT with march     N   SKTC 5x 10 seconds   N   Piriformis supine with strap 5x 10 seconds   N   Lo-back Trax 10x10 seconds   N   Objective reassessment   Y   HEP creation and  review    Y    Access Code: [email or web address]  URL: [email or web address]  Date: 12/09/2023  Prepared by: Doy Mince    Exercises  - Supine Lower Trunk Rotation  - 1 x daily - 7 x weekly - 2 sets - 10 reps  - Hooklying Single Knee to Chest Stretch with Towel  - 1 x daily - 7 x weekly - 1 sets - 3-5 reps - 10 seconds hold  - Supine Piriformis Stretch with Towel  - 1 x daily - 7 x weekly - 1 sets - 3-5 reps - 10 seconds hold  - Supine Bridge  - 1 x daily - 7 x weekly - 3 sets - 5 reps  - Supine Posterior Pelvic Tilt  - 1 x daily - 7 x weekly - 1 sets - 15 reps - 3 seconds hold  - Supine March with Posterior Pelvic Tilt  - 1 x daily - 7 x weekly - 1 sets - 10 reps  - Cat Cow  - 1 x daily - 7 x weekly - 2 sets - 10 reps  - Child's Pose Stretch  - 1 x daily - 7 x weekly - 1 sets - 2-3 reps - 15-30 seconds hold  - Seated Hamstring Stretch  - 1 x daily - 7 x weekly - 1 sets - 3-5 reps - 15-30 seconds hold    Assessment: Patient has made fair progress towards established short and long term PT goals. Patient demos improved tolerance to sleeping and standing to shower. Objectively patient has improved lumbar AROM in all planes and improved BLE gross muscle strength. Patient continues to present with sporadic "muscle jerking" that is worse with activity.  Despite objective improvements patient has not seen a correlating reduction in pain. Patient notes limited change to LBP or radicular symptoms. Patient to be D/C from POC at this time.   Goals:     Short-Term Goals: 2 Weeks     - Patient will demonstrate tolerance to PT intervention ( MET 12/09/23 )     - Patient will be independent with progressive HEP to maximize gains from PT. ( MET 11/30/23 )     - Patient will report max 7 LBP to aid in participation in PT. ( MET 12/09/23 )  - Patient will report reduced radicular symptoms by 5% to aid in house ADLs. ( NOT MET 12/09/23 )     - Patient will demonstrate tolerance to gentle lumbar traction ( MET 11/30/23 )           Long-Term Goals: 4 Weeks     - Patient will report improved tolerance to sleeping ( MET 12/09/23 )    - Patient will demonstrate improved tolerance to stair navigation ( NOT MET 12/09/23 )     - Patient will demonstrate improved functional ability via improved Patient Specific Functional Score to at least 5. ( MET 11/30/23 )     - Patient will report sleep not disrupted by back pain to aid in participation of functional ADLs during the day. ( NOT MET 12/09/23 )        Plan:  D/C to HEP and refer back to referring provider    Total Session Time 40 and Timed code minutes 40  THERAPEUTIC EXERCISE 40 minutes      Doy Mince, PT  12/09/2023, 10:28

## 2023-12-21 ENCOUNTER — Encounter (INDEPENDENT_AMBULATORY_CARE_PROVIDER_SITE_OTHER): Payer: Self-pay | Admitting: PHYSICIAN ASSISTANT

## 2023-12-22 ENCOUNTER — Telehealth (INDEPENDENT_AMBULATORY_CARE_PROVIDER_SITE_OTHER): Payer: Self-pay | Admitting: Neurological Surgery

## 2024-01-21 ENCOUNTER — Ambulatory Visit
Admission: RE | Admit: 2024-01-21 | Discharge: 2024-01-21 | Disposition: A | Discharge status: Home / Self Care | Source: Ambulatory Visit | Attending: Neurological Surgery | Admitting: Neurological Surgery

## 2024-01-21 ENCOUNTER — Other Ambulatory Visit: Payer: Self-pay

## 2024-01-21 ENCOUNTER — Ambulatory Visit (HOSPITAL_COMMUNITY)

## 2024-01-21 ENCOUNTER — Encounter (HOSPITAL_BASED_OUTPATIENT_CLINIC_OR_DEPARTMENT_OTHER)
Admission: RE | Disposition: A | Discharge status: Home / Self Care | Payer: Self-pay | Source: Ambulatory Visit | Attending: Neurological Surgery

## 2024-01-21 DIAGNOSIS — M5416 Radiculopathy, lumbar region: Secondary | ICD-10-CM | POA: Insufficient documentation

## 2024-01-21 SURGERY — LUMBAR / SACRAL TRANSFORAMINAL INJECTION
Anesthesia: Local (Nurse-Monitored) | Site: Back | Laterality: Bilateral | Wound class: Clean Wound: Uninfected operative wounds in which no inflammation occurred

## 2024-01-21 MED ORDER — TRIAMCINOLONE ACETONIDE 40 MG/ML SUSPENSION FOR INJECTION
Freq: Once | INTRAMUSCULAR | Status: DC | PRN
Start: 2024-01-21 — End: 2024-01-21
  Administered 2024-01-21: 40 mg via INTRAMUSCULAR

## 2024-01-21 MED ORDER — BUPIVACAINE (PF) 0.25 % (2.5 MG/ML) INJECTION SOLUTION
Freq: Once | INTRAMUSCULAR | Status: DC | PRN
Start: 2024-01-21 — End: 2024-01-21
  Administered 2024-01-21: 5 mL

## 2024-01-21 MED ORDER — IOPAMIDOL 200 MG IODINE/ML (41 %) INTRATHECAL SOLUTION
Freq: Once | INTRATHECAL | Status: DC | PRN
Start: 2024-01-21 — End: 2024-01-21
  Administered 2024-01-21: 3 mL via INTRATHECAL

## 2024-01-21 MED ORDER — LIDOCAINE HCL 10 MG/ML (1 %) INJECTION SOLUTION
Freq: Once | INTRAMUSCULAR | Status: DC | PRN
Start: 2024-01-21 — End: 2024-01-21
  Administered 2024-01-21: 10 mL via INTRADERMAL

## 2024-01-21 SURGICAL SUPPLY — 13 items
APPL 70% ISPRP 2% CHG 26ML CHLRPRP HI-LT ORNG PREP STRL LF  DISP CLR (MED SURG SUPPLIES) ×1 IMPLANT
CONV USE 85144 - NEEDLE 1.5IN 18GA FIL STRL BLUNT MONOJECT LF  DISP (MED SURG SUPPLIES) ×1 IMPLANT
DRAPE FLRSCP CARM 36X30IN EQP (DRAPE/PACKS/SHEETS/OR TOWEL) ×1 IMPLANT
GLOVE SURG 7.5 LF  PF BEAD CUF STRL CRM 11.8IN PROTEXIS PI PLISPRN THK9.1 MIL (GLOVES AND ACCESSORIES) ×1 IMPLANT
HCL STERILE STAND. LABEL PROOF # 331641 (MED SURG SUPPLIES) ×1 IMPLANT
NEEDLE HYPO  25GA 1.5IN REG WL PRCSNGL SS POLYPROP REG BVL LL HUB DEHP-FR BLU STRL LF  DISP (MED SURG SUPPLIES) ×1 IMPLANT
NEEDLE SPINAL 3.5IN 22GA SPNCN QUINCKE BPA PVC FREE DEHP-FR STRL LF (MED SURG SUPPLIES) ×1 IMPLANT
PEN SURG MRKNG DVN SKIN DISP FN TIP FLXB RLR CAP LBL GNTN VIOL STRL LF (MED SURG SUPPLIES) ×1 IMPLANT
SPONGE GAUZE 4X4IN CURITY PLASTIC COTTON 12 PLY MAX ABS TRY LF  STRL DISP (WOUND CARE SUPPLY) ×1 IMPLANT
SYRINGE LL 10ML LF  STRL CONTROL CONCEN TIP PRGN FREE DEHP-FR MED DISP (MED SURG SUPPLIES) ×1 IMPLANT
SYRINGE LL 10ML LF  STRL GRAD N-PYRG DEHP-FR PVC FREE MED DISP (MED SURG SUPPLIES) ×2 IMPLANT
SYRINGE LL 5ML LF STRL GRAD N-PYRG DEHP-FR PVC FREE MED DISP CLR (MED SURG SUPPLIES) ×2 IMPLANT
TOWEL 26X16IN COTTON BLU SAF DISP SURG STRL LF (DRAPE/PACKS/SHEETS/OR TOWEL) ×1 IMPLANT

## 2024-01-21 NOTE — OR Surgeon (Signed)
 [place]  [facility]  DEPARTMENT OF NEUROSURGERY  OPERATION SUMMARY     PATIENT NAME:  Miranda Young   DATE OF BIRTH:  22-Nov-1975  HOSPITAL NUMBER:  [id number]   DATE OF SERVICE:  01/21/2024      PREOPERATIVE DIAGNOSIS:  Bilateral L5 Radiculopathy    Postoperative diagnosis:  Same    NAME OF PROCEDURES:   Bilateral L5 transforaminal epidural steroid injections           INTRAOPERATIVE FINDINGS:  See operative detail     SURGEON:  Francine Iron, MD  .   ASSISTANT:  None     ANESTHESIA TYPE:  Local     ESTIMATED BLOOD LOSS:  1 cc     BLOOD GIVEN:   None.     FLUIDS GIVEN:  None     COMPLICATIONS:  None.     WOUND CLASS:  None     TUBES:  None.     DRAINS:  None.     SPECIMENS/CULTURES:  None.     IMPLANTS:  None     EXPLANTS:  none     INDICATIONS FOR PROCEDURE:  [name] is a 48 y.o. female has a history of bilateral L5 radiculopathy was indicated for a transforaminal epidural steroid injection.    DESCRIPTION OF PROCEDURE:   The patient was brought to the operating room placed on a flat bed with appropriate padding in a prone position.  We then brought in AP fluoroscopy after the patient was prepped and draped in a sterile fashion in order to localize our injection site.  We proceeded to mark the injection site on the appropriate side for the injection.  We then infiltrated with lidocaine  to numb the skin and proceeded to take a 22 gauge spine needle inserting it through skin towards the neural foramina at the appropriate level designated for this injection which was bilateral L5.  Use a lateral fluoroscopic image to confirm our positioning within the neural foramen and once we had good images showing placement of the needle we then proceeded to go back to the AP position and used Isovue dye to confirm our location within the foramina.  After confirmation we then proceed with placement of our epidural steroid injection which was a mixture of Kenalog 40 mg and 0.25% Marcaine.  This process was repeated  in the same sequence if this was a bilateral injection.  This was injected without complication the patient tolerated the procedure well and was taken to recovery in a stable condition.      Francine Iron, MD    01/21/2024;10:04

## 2024-01-21 NOTE — Discharge Instructions (Signed)
 Rest today.   Resume usual activity tomorrow.   May shower today.   Resume blood thinners tomorrow.

## 2024-02-04 ENCOUNTER — Ambulatory Visit (INDEPENDENT_AMBULATORY_CARE_PROVIDER_SITE_OTHER): Payer: Self-pay | Admitting: PHYSICIAN ASSISTANT

## 2024-02-09 ENCOUNTER — Other Ambulatory Visit: Payer: Self-pay

## 2024-02-09 ENCOUNTER — Ambulatory Visit: Payer: Self-pay | Attending: PHYSICIAN ASSISTANT | Admitting: PHYSICIAN ASSISTANT

## 2024-02-09 ENCOUNTER — Encounter (INDEPENDENT_AMBULATORY_CARE_PROVIDER_SITE_OTHER): Payer: Self-pay | Admitting: PHYSICIAN ASSISTANT

## 2024-02-09 VITALS — Ht 65.0 in | Wt 210.0 lb

## 2024-02-09 DIAGNOSIS — M5416 Radiculopathy, lumbar region: Secondary | ICD-10-CM

## 2024-02-09 NOTE — Progress Notes (Signed)
 NEUROSURGERY, MEDICAL OFFICE BUILDING WEST  [address]  [place] [address]  Operated by [facility]  Telephone Visit    Name:  Miranda Young MRN: [id number]   Date:  02/09/2024 DOB: 1975-11-23 (48 y.o.)          The patient/family initiated a request for telephone service.  Verbal consent for this service was obtained from the patient/family.  Reason for audio only:Patient preference    Last office visit in this department: 10/26/2023      Reason for call:  Injection follow up  Call notes:  Patient had a bilateral L5 transforaminal 2 weeks ago and this is a visit via telehealth to discuss the results from that injection.  She overall reports good improvement of her back and leg symptoms with the injection.  She has some slight pain in the left lower leg that has persisted but overall she is pleased with the results.  She would like to schedule another injection for 3 months out to help with the residual pain she is having as well as any pain that may recur.  We will plan for a bilateral L5 transforaminal epidural steroid injection in September.  She will see Neurology in October for the abnormal movements and jerking in her legs although she does report that this is somewhat improved after her injection as well.      Review of Systems:  Review of systems pertinent negatives and postives as discussed in HPI.     Objective:   Neurological Exam  Deferred for telehealth visit.    Data reviewed:  Imaging:  No new imaging.    Assessment & Plan:       ICD-10-CM    1. Lumbar radiculopathy  M54.16         We will continue with plans for a three-month bilateral L5 transforaminal epidural steroid injection in September 2025.  We will follow up after that to assess her response.  She will keep plans to be evaluated by Neurology in October for some abnormal motions involving the legs.    On the day of the encounter, a total of  15  minutes was spent on this patient encounter including review of historical  information, examination, documentation and post-visit activities. The time documented excludes procedural time.  10 minutes of this time was spent on the telephone with the patient.  Leland Purser, PA-C    This note was partially generated using MModal Fluency Direct system, and there may be some incorrect words, spellings, and punctuation that were not noted in checking the note before saving.

## 2024-02-16 ENCOUNTER — Other Ambulatory Visit (HOSPITAL_COMMUNITY): Payer: Self-pay | Admitting: NURSE PRACTITIONER

## 2024-02-16 DIAGNOSIS — Z1231 Encounter for screening mammogram for malignant neoplasm of breast: Secondary | ICD-10-CM

## 2024-03-09 ENCOUNTER — Ambulatory Visit (HOSPITAL_COMMUNITY): Payer: Self-pay

## 2024-03-10 ENCOUNTER — Ambulatory Visit (HOSPITAL_COMMUNITY)

## 2024-03-29 ENCOUNTER — Other Ambulatory Visit: Payer: Self-pay

## 2024-03-29 ENCOUNTER — Encounter (HOSPITAL_COMMUNITY): Payer: Self-pay

## 2024-03-29 ENCOUNTER — Emergency Department: Admission: EM | Admit: 2024-03-29 | Discharge: 2024-03-29 | Disposition: A | Discharge status: Home / Self Care

## 2024-03-29 ENCOUNTER — Emergency Department (HOSPITAL_COMMUNITY)

## 2024-03-29 ENCOUNTER — Ambulatory Visit (HOSPITAL_COMMUNITY)

## 2024-03-29 DIAGNOSIS — M25552 Pain in left hip: Secondary | ICD-10-CM

## 2024-03-29 DIAGNOSIS — S8262XA Displaced fracture of lateral malleolus of left fibula, initial encounter for closed fracture: Secondary | ICD-10-CM | POA: Insufficient documentation

## 2024-03-29 DIAGNOSIS — W101XXA Fall (on)(from) sidewalk curb, initial encounter: Secondary | ICD-10-CM | POA: Insufficient documentation

## 2024-03-29 DIAGNOSIS — W010XXA Fall on same level from slipping, tripping and stumbling without subsequent striking against object, initial encounter: Secondary | ICD-10-CM

## 2024-03-29 MED ORDER — IBUPROFEN 800 MG TABLET
ORAL_TABLET | ORAL | Status: AC
Start: 2024-03-29 — End: 2024-03-29
  Filled 2024-03-29: qty 1

## 2024-03-29 MED ORDER — IBUPROFEN 800 MG TABLET
800.0000 mg | ORAL_TABLET | ORAL | Status: AC
Start: 2024-03-29 — End: 2024-03-29
  Administered 2024-03-29: 800 mg via ORAL

## 2024-03-29 NOTE — ED Triage Notes (Signed)
 Tripped and fell off of a side walk injuring left ankle; swelling noted; pulse present and palpable

## 2024-03-29 NOTE — ED Provider Notes (Signed)
 Emergency Medicine      Name: Miranda Young  Age and Gender: 48 y.o. female  Date of Birth: 1975-09-26  MRN: [id number]  PCP: Melissa D Lucado, FNP-C    CC:  Chief Complaint   Patient presents with    Ankle Injury       HPI:  Miranda Young is a 48 y.o. White female who presents to the ER with reports of tripping and falling off a sidewalk rolling her left ankle.  Patient also reports left hip pain.  Swelling is noted to the patient's left ankle.  Patient denies hitting her head or LOC.  Patient denies other complaints.    Below pertinent information reviewed with patient:  Past Medical History:   Diagnosis Date    Anxiety     Bipolar disorder, unspecified     Depression     Diabetes mellitus, type 2     Esophageal reflux     High triglycerides     HTN (hypertension)     Hypercholesterolemia     Lumbar back pain     2 herniated discs    Sleep apnea     has CPAP           Allergies[1]    Past Surgical History:   Procedure Laterality Date    B/L L5 TFESI Bilateral 01/21/2024    Performed by Thurmon Norleen SAUNDERS, MD at [facility]    COLONOSCOPY N/A 02/10/2022    Performed by Girard Camellia GORMAN, MD at [facility]    EGD  WITH BIOPSY N/A 02/10/2022    Performed by Girard Camellia GORMAN, MD at [facility]    ESOPHAGOGASTRODUODENOSCOPY      HX APPENDECTOMY      HX COLONOSCOPY      HX HYSTERECTOMY      HX TUBAL LIGATION          Social History     Socioeconomic History    Marital status: Divorced   Tobacco Use    Smoking status: Former     Types: Cigarettes    Smokeless tobacco: Never   Vaping Use    Vaping status: Never Used   Substance and Sexual Activity    Alcohol use: Not Currently    Drug use: Never       ROS:  No other overt positive review of systems are noted other than stated in the HPI.      Objective:    ED Triage Vitals [03/29/24 1321]   BP (Non-Invasive) (!) 142/92   Heart Rate (!) 109   Respiratory Rate 18   Temperature 37.2 C (99 F)   SpO2 96 %   Weight 93 kg (205 lb)   Height 1.651 m (5' 5)     Filed Vitals:    03/29/24  1321   BP: (!) 142/92   Pulse: (!) 109   Resp: 18   Temp: 37.2 C (99 F)   SpO2: 96%       Nursing notes and vital signs reviewed.    Constitutional - No acute distress.  Alert and Active.  HEENT - Normocephalic. Atraumatic. PERRL. EOMI. Conjunctiva clear. TM's pearly grey, translucent, without bulging or retraction. Oropharynx with no erythema, lesions, or exudates. Moist mucous membranes.   Neck - Trachea midline. No stridor. No hoarseness.  Cardiac - Regular rate and rhythm. No murmurs, rubs, or gallops. Intact distal pulses.  Respiratory/Chest - Normal respiratory effort. Clear to auscultation bilaterally. No rales,  wheezes or rhonchi. No chest tenderness.  Abdomen - Normal bowel sounds. Non-tender, soft, non-distended. No rebound or guarding.   Musculoskeletal - Good AROM. No muscle or joint tenderness appreciated. No clubbing, cyanosis or edema.  Skin - Warm and dry, without any rashes or other lesions.  Neuro - Alert and oriented x 3. Cranial nerves II-XII are grossly intact.  Moving all extremities symmetrically.   Psych - Normal mood and affect. Behavior is normal         Any pertinent labs and imaging obtained during this encounter reviewed below in MDM.  No results found for this visit on 03/29/24 (from the past 720 hours).      MDM/ED Course:  Patient presents to the ER with reports of tripping and falling off a sidewalk rolling her left ankle.  Patient also reports left hip pain.  Swelling is noted to the patient's left ankle.  Patient denies hitting her head or LOC.  Patient denies other complaints.    Patient underwent diagnostics with results as noted in ED course.  An x-ray was ordered of the patient's bilateral hips and pelvis and her left ankle.  The patient's x-ray of the left ankle shows an avulsion fracture lateral malleolus.  Dr. Arvin was contacted and advised to place the patient in a boot and have her follow up in the office.  I did discuss these results with the patient who  verbalizes understanding of this.  Patient's x-ray of the hips and bilateral pelvis showed no acute fracture.    Patient was found/suspected to have avulsion fracture lateral malleolus of the left.    Patient will be discharged in stable condition at this time.    Medical Decision Making  Amount and/or Complexity of Data Reviewed  Radiology: ordered. Decision-making details documented in ED Course.      ED Course as of 03/29/24 1512   Wed Mar 29, 2024   1422 XR ANKLE LEFT  Avulsion fracture lateral malleolus   1422 Dr. Stevie contacted for consult.    1457 XR HIPS BILATERAL W PELVIS 3-4 VIEWS  No acute fracture of the hips or bony pelvis on today's study       Orders Placed This Encounter    CANCELED: XR ANKLE LEFT 2 VIEW    XR ANKLE LEFT    CANCELED: XR LOW PELVIS W BILATERAL HIPS    XR HIPS BILATERAL W PELVIS 3-4 VIEWS    ibuprofen  (MOTRIN ) tablet     Impression:   Clinical Impression   Closed avulsion fracture of lateral malleolus of left fibula, initial encounter (Primary)       Disposition: Discharged      Portions of this note may have been dictated using voice recognition software.     -----------------------  No results found for this or any previous visit (from the past 12 hours).  XR HIPS BILATERAL W PELVIS 3-4 VIEWS   Final Result   No acute fracture of the hips or bony pelvis on today's study   If there is continued pain and concern for occult bony injury or internal derangement, follow-up with MRI of the area of concern can BE considered                Radiologist location ID: [id number]         XR ANKLE LEFT   Final Result   Avulsion fracture lateral malleolus  Radiologist location ID: [id number]                  [1]   Allergies  Allergen Reactions    Amoxicillin Nausea/ Vomiting    Iron  Nausea/ Vomiting     Iron  pills never tried iv iron 

## 2024-03-29 NOTE — ED Nurses Note (Signed)
 Pt evaluated and treated by medical provider while in waiting room area. No primary RN assigned; therefore, no assessment performed. Walking boot placed to left foot. Assisted from ER by security. All paperwork given and questions answered. Pt acknowledged all understanding. Leaving waiting area.

## 2024-03-29 NOTE — ED Nurses Note (Signed)
Ice pack given and applied to left ankle.

## 2024-03-29 NOTE — ED APP Handoff Note (Signed)
 [facility] - Emergency Department  Emergency Department  Provider in Triage Note    Name: EVADNE OSE  Age: 48 y.o.  Gender: female     Subjective:   ONEKA PARADA is a 48 y.o. female who presents with complaint of No chief complaint on file.  .      Objective:   Filed Vitals:    03/29/24 1321   BP: (!) 142/92   Pulse: (!) 109   Resp: 18   Temp: 37.2 C (99 F)   SpO2: 96%      Focused Physical Exam shows left ankle discomfort swelling, noted, more to the lateral side    Assessment:  A medical screening exam was completed.  This patient is a 48 y.o. female with initial findings showing left ankle pain    Plan:  Please see initial orders and work-up below.  This is to be continued with full evaluation in the main Emergency Department.     No current facility-administered medications for this encounter.     No results found for this or any previous visit (from the past 24 hours).     Dalante Minus, FNP  03/29/2024, 13:23

## 2024-03-29 NOTE — Discharge Instructions (Signed)
 Thank you for allowing us  to be part of your care.  Please call today or in the morning and make an appointment to see an orthopedist.    Please discuss all medications with your pharmacist to ensure there are no concerns of interactions.    Please ensure all questions or concerns are addressed prior to leaving the hospital. We want to make sure your concerns are addressed to make sure you are as safe and healthy as possible. By leaving the hospital, it is understood you are in agreement with your treatment plan.    Please call the hospital medical records office for a copy of your finalized results, and review them with a primary care physician, for any findings needing further attention.    If you feel your situation worsens, or does not get better in 48 hours, please see a physician for evaluation.    We encourage you to see your regular doctor as soon as possible to let them know you were seen in the emergency department. They may want to do further testing. If you do not have a doctor, please feel free to call the hospital, and ask for contact information of accepting providers. Please also discuss your vaccinations, and ensure all are up to date.    You may use this document to take today off work or school.

## 2024-05-12 ENCOUNTER — Encounter (HOSPITAL_BASED_OUTPATIENT_CLINIC_OR_DEPARTMENT_OTHER): Payer: Self-pay | Admitting: Neurological Surgery

## 2024-05-12 ENCOUNTER — Encounter (HOSPITAL_BASED_OUTPATIENT_CLINIC_OR_DEPARTMENT_OTHER)
Admission: RE | Disposition: A | Discharge status: Home / Self Care | Payer: Self-pay | Source: Ambulatory Visit | Attending: Neurological Surgery

## 2024-05-12 ENCOUNTER — Other Ambulatory Visit: Payer: Self-pay

## 2024-05-12 ENCOUNTER — Ambulatory Visit
Admission: RE | Admit: 2024-05-12 | Discharge: 2024-05-12 | Disposition: A | Discharge status: Home / Self Care | Source: Ambulatory Visit | Attending: Neurological Surgery | Admitting: Neurological Surgery

## 2024-05-12 ENCOUNTER — Ambulatory Visit (HOSPITAL_COMMUNITY)

## 2024-05-12 DIAGNOSIS — M5410 Radiculopathy, site unspecified: Secondary | ICD-10-CM | POA: Diagnosis present

## 2024-05-12 DIAGNOSIS — M5416 Radiculopathy, lumbar region: Secondary | ICD-10-CM | POA: Insufficient documentation

## 2024-05-12 SURGERY — LUMBAR / SACRAL TRANSFORAMINAL INJECTION
Anesthesia: Local (Nurse-Monitored) | Site: Back | Laterality: Bilateral | Wound class: Clean Wound: Uninfected operative wounds in which no inflammation occurred

## 2024-05-12 MED ORDER — BUPIVACAINE (PF) 0.25 % (2.5 MG/ML) INJECTION SOLUTION
Freq: Once | INTRAMUSCULAR | Status: DC | PRN
Start: 2024-05-12 — End: 2024-05-12
  Administered 2024-05-12 (×2): 4 mL via INTRAMUSCULAR

## 2024-05-12 SURGICAL SUPPLY — 13 items
APPL 70% ISPRP 2% CHG 26ML CHLRPRP HI-LT ORNG PREP STRL LF  DISP CLR (MED SURG SUPPLIES) ×1 IMPLANT
CONV USE 85144 - NEEDLE 1.5IN 18GA FIL STRL BLUNT MONOJECT LF  DISP (MED SURG SUPPLIES) ×1 IMPLANT
DRAPE FLRSCP CARM 36X30IN EQP (DRAPE/PACKS/SHEETS/OR TOWEL) ×1 IMPLANT
GLOVE SURG 7.5 LF  PF BEAD CUF STRL CRM 11.8IN PROTEXIS PI PLISPRN THK9.1 MIL (GLOVES AND ACCESSORIES) ×1 IMPLANT
HCL STERILE STAND. LABEL PROOF # 331641 (MED SURG SUPPLIES) ×1 IMPLANT
NEEDLE HYPO  25GA 1.5IN REG WL PRCSNGL SS POLYPROP REG BVL LL HUB DEHP-FR BLU STRL LF  DISP (MED SURG SUPPLIES) ×1 IMPLANT
NEEDLE SPINAL 3.5IN 22GA SPNCN QUINCKE BPA PVC FREE DEHP-FR STRL LF (MED SURG SUPPLIES) ×1 IMPLANT
PEN SURG MRKNG DVN SKIN DISP FN TIP FLXB RLR CAP LBL GNTN VIOL STRL LF (MED SURG SUPPLIES) ×1 IMPLANT
SPONGE GAUZE 4X4IN CURITY PLASTIC COTTON 12 PLY MAX ABS TRY LF  STRL DISP (WOUND CARE SUPPLY) ×1 IMPLANT
SYRINGE LL 10ML LF  STRL CONTROL CONCEN TIP PRGN FREE DEHP-FR MED DISP (MED SURG SUPPLIES) ×1 IMPLANT
SYRINGE LL 10ML LF  STRL GRAD N-PYRG DEHP-FR PVC FREE MED DISP (MED SURG SUPPLIES) ×2 IMPLANT
SYRINGE LL 5ML LF STRL GRAD N-PYRG DEHP-FR PVC FREE MED DISP CLR (MED SURG SUPPLIES) ×2 IMPLANT
TOWEL 26X16IN COTTON BLU SAF DISP SURG STRL LF (DRAPE/PACKS/SHEETS/OR TOWEL) ×1 IMPLANT

## 2024-05-12 NOTE — OR Surgeon (Signed)
 [place]  [facility]  DEPARTMENT OF NEUROSURGERY  OPERATION SUMMARY     PATIENT NAME:  Miranda Young   DATE OF BIRTH:  07/21/1976  HOSPITAL NUMBER:  [id number]   DATE OF SERVICE:  05/12/2024      PREOPERATIVE DIAGNOSIS:  Bilateral L5 Radiculopathy    Postoperative diagnosis:  Same    NAME OF PROCEDURES:   Bilateral L5 transforaminal epidural steroid injections           INTRAOPERATIVE FINDINGS:  See operative detail     SURGEON:  Norleen JONELLE Chute, MD  .   ASSISTANT:  None     ANESTHESIA TYPE:  Local     ESTIMATED BLOOD LOSS:  1 cc     BLOOD GIVEN:   None.     FLUIDS GIVEN:  None     COMPLICATIONS:  None.     WOUND CLASS:  None     TUBES:  None.     DRAINS:  None.     SPECIMENS/CULTURES:  None.     IMPLANTS:  None     EXPLANTS:  none     INDICATIONS FOR PROCEDURE:  Miranda Young is a 48 y.o. female has a history of bilateral L5 radiculopathy was indicated for a transforaminal epidural steroid injection.    DESCRIPTION OF PROCEDURE:   The patient was brought to the operating room placed on a flat bed with appropriate padding in a prone position.  We then brought in AP fluoroscopy after the patient was prepped and draped in a sterile fashion in order to localize our injection site.  We proceeded to mark the injection site on the appropriate side for the injection it was bilateral L5.  We then infiltrated with lidocaine  to numb the skin and proceeded to take a 22 gauge spine needle inserting it through skin towards the neural foramina at the appropriate level designated for this injection.  Use a lateral fluoroscopic image to confirm our positioning within the neural foramen and once we had good images showing placement of the needle we then proceeded to go back to the AP position and used Isovue  dye to confirm our location within the foramina.  After confirmation we then proceed with placement of our epidural steroid injection which was a mixture of Kenalog  40 mg and 0.25% Marcaine .  This process was repeated in  the same sequence if this was a bilateral injection.  This was injected without complication the patient tolerated the procedure well and was taken to recovery in a stable condition.      Norleen JONELLE Chute, MD    05/12/2024;11:10

## 2024-05-12 NOTE — Discharge Instructions (Addendum)
 SURGICAL DISCHARGE INSTRUCTIONS     Dr. Thurmon, Norleen SAUNDERS, MD  performed your B/L L5 TFESI today at the [facility]:  Monday through Friday from 8 a.m. - 4 p.m.: [phone]   Between 4 p.m. - 8 a.m., weekends and holidays:  Call ER [phone]    PLEASE SEE WRITTEN HANDOUTS AS DISCUSSED BY YOUR NURSE:      SIGNS AND SYMPTOMS OF A WOUND / INCISION INFECTION   Be sure to watch for the following:  Increase in redness or red streaks near or around the wound or incision.  Increase in pain that is intense or severe and cannot be relieved by the pain medication that your doctor has given you.  Increase in swelling that cannot be relieved by elevation of a body part, or by applying ice, if permitted.  Increase in drainage, or if yellow / green in color and smells bad. This could be on a dressing or a cast.  Increase in fever for longer than 24 hours, or an increase that is higher than 101 degrees Fahrenheit (normal body temperature is 98 degrees Fahrenheit). The incision may feel warm to the touch.    **CALL YOUR DOCTOR IF ONE OR MORE OF THESE SIGNS / SYMPTOMS SHOULD OCCUR.    ANESTHESIA INFORMATION   ANESTHESIA -- ADULT PATIENTS:  You have received intravenous sedation / general anesthesia, and you may feel drowsy and light-headed for several hours. You may even experience some forgetfulness of the procedure. DO NOT DRIVE A MOTOR VEHICLE or perform any activity requiring complete alertness or coordination until you feel fully awake in about 24-48 hours. Do not drink alcoholic beverages for at least 24 hours. Do not stay alone, you must have a responsible adult available to be with you. You may also experience a dry mouth or nausea for 24 hours. This is a normal side effect and will disappear as the effects of the medication wear off.    REMEMBER   If you experience any difficulty breathing, chest pain, bleeding that you feel is excessive, persistent nausea or  vomiting or for any other concerns:  Call your physician Dr.  Thurmon, Norleen SAUNDERS, MD . You may also ask to have the general doctor on call paged. They are available to you 24 hours a day.      SPECIAL INSTRUCTIONS / COMMENTS   Refer to Dr. Thurmon discharge instruction sheet    FOLLOW-UP APPOINTMENTS   Please call your surgeon's office at the number listed to schedule a date / time of return for follow-up.           Rest today.   Resume usual activity tomorrow.   May shower today.   Resume blood thinners tomorrow.

## 2024-06-05 ENCOUNTER — Encounter (INDEPENDENT_AMBULATORY_CARE_PROVIDER_SITE_OTHER): Payer: Self-pay | Admitting: PHYSICIAN ASSISTANT

## 2024-06-07 ENCOUNTER — Other Ambulatory Visit (INDEPENDENT_AMBULATORY_CARE_PROVIDER_SITE_OTHER): Payer: Self-pay | Admitting: PHYSICIAN ASSISTANT

## 2024-06-07 DIAGNOSIS — M5416 Radiculopathy, lumbar region: Secondary | ICD-10-CM

## 2024-06-15 ENCOUNTER — Ambulatory Visit (INDEPENDENT_AMBULATORY_CARE_PROVIDER_SITE_OTHER): Payer: Self-pay | Admitting: Neurological Surgery

## 2024-06-20 ENCOUNTER — Ambulatory Visit (INDEPENDENT_AMBULATORY_CARE_PROVIDER_SITE_OTHER): Payer: Self-pay | Admitting: NEUROLOGY

## 2024-06-25 ENCOUNTER — Other Ambulatory Visit: Payer: Self-pay

## 2024-06-26 ENCOUNTER — Encounter (INDEPENDENT_AMBULATORY_CARE_PROVIDER_SITE_OTHER): Payer: Self-pay

## 2024-06-26 ENCOUNTER — Ambulatory Visit (INDEPENDENT_AMBULATORY_CARE_PROVIDER_SITE_OTHER)

## 2024-06-26 VITALS — BP 140/98 | HR 88 | Resp 18 | Ht 65.0 in | Wt 201.0 lb

## 2024-06-26 DIAGNOSIS — F411 Generalized anxiety disorder: Secondary | ICD-10-CM

## 2024-06-26 DIAGNOSIS — M5416 Radiculopathy, lumbar region: Secondary | ICD-10-CM

## 2024-06-26 DIAGNOSIS — G4733 Obstructive sleep apnea (adult) (pediatric): Secondary | ICD-10-CM

## 2024-06-26 DIAGNOSIS — R7303 Prediabetes: Secondary | ICD-10-CM

## 2024-06-26 DIAGNOSIS — M792 Neuralgia and neuritis, unspecified: Secondary | ICD-10-CM

## 2024-06-26 DIAGNOSIS — F4310 Post-traumatic stress disorder, unspecified: Secondary | ICD-10-CM

## 2024-06-26 DIAGNOSIS — F3181 Bipolar II disorder: Secondary | ICD-10-CM

## 2024-06-26 NOTE — H&P (Signed)
 [facility]  [address]  [place] [address]    History and Physical    Name: Miranda Young MRN:  [id number]   Date: 06/26/2024 DOB:  1976/01/21 (48 y.o.)               Provider: Omega KATHEE Sides, DO  PCP: Melissa D Lucado, APRN, CNP  Referring Provider: [place] JINNY Piety     Reason for visit: Lumbar Radiculopathy      History of Present Illness  Miranda Young is a 48 year old female with prediabetes and chronic back pain who presents for evaluation of leg symptoms and pain management. She was referred by Dr Pierce office for closer management of her leg symptoms and pain relief.    She experiences chronic leg symptoms characterized by nerve pain radiating down to her ankles, with the left leg more affected than the right. The pain is more intense at night, requiring her to hold onto the sheet to turn over due to lower back pain. The nerve pain is constant, with shooting pain occurring when attempting to move without support. Walking exacerbates her symptoms, causing weakness and balance issues.    She has received epidural steroid injections, with the most recent in September providing approximately 70% relief for four weeks, though the initial injection in May provided 80% relief. Physical therapy completed in April improved her range of motion but did not alleviate the leg pain.    She has a history of anxiety, bipolar II disorder, generalized anxiety disorder, and PTSD. She experiences a tremor associated with anxiety, which worsens when her anxiety is heightened. She is stable on her psychiatric medications and has been for four to five years. She attends counseling at [facility] with [employer].    Her past medical history includes a hysterectomy and appendectomy. She is prediabetic and has been diagnosed with sleep apnea, for which she has used a CPAP machine for 10-15 years, though she is currently taking a break due to anxiety-related issues with the mask. She has a  history of using diclofenac  for pain management, which she resumed after discontinuing ibuprofen  post-ankle fracture in July.    She reports a history of balance issues and weakness, which contributed to an ankle fracture in July. She expresses concern about participating in exercise classes due to fear of falling.         Past Medical History:  Past Medical History:   Diagnosis Date    Anxiety     Bipolar disorder, unspecified     Depression     Diabetes mellitus, type 2     Esophageal reflux     High triglycerides     HTN (hypertension)     Hypercholesterolemia     Lumbar back pain     2 herniated discs    Sleep apnea     has CPAP     Past Surgical History:   Procedure Laterality Date    ESOPHAGOGASTRODUODENOSCOPY      HX APPENDECTOMY      HX COLONOSCOPY      HX HYSTERECTOMY      HX TUBAL LIGATION        Allergies[1]  Current Outpatient Medications   Medication Sig    amLODIPine (NORVASC) 10 mg Oral Tablet Take 1 Tablet (10 mg total) by mouth Daily    atorvastatin  (LIPITOR) 80 mg Oral Tablet Take 1 Tablet (80 mg total) by mouth Daily    buPROPion (WELLBUTRIN SR) 150  mg Oral tablet sustained-release 12 hr Take 1 Tablet (150 mg total) by mouth Every morning    busPIRone  (BUSPAR ) 15 mg Oral Tablet Take 1 Tablet (15 mg total) by mouth Three times a day    diclofenac  sodium (VOLTAREN ) 75 mg Oral Tablet, Delayed Release (E.C.) Take 1 Tablet (75 mg total) by mouth Three times a day Dose verified with patient    ergocalciferol, vitamin D2, (DRISDOL) 1,250 mcg (50,000 unit) Oral Capsule Take 1 Capsule (50,000 Units total) by mouth Every 7 days    furosemide (LASIX) 40 mg Oral Tablet Take 1 Tablet (40 mg total) by mouth Daily    hydrOXYzine  HCL (ATARAX ) 50 mg Oral Tablet Take 1 Tablet (50 mg total) by mouth Four times a day    Ibuprofen  (MOTRIN ) 200 mg Oral Tablet Take 4 Tablets (800 mg total) by mouth Three times a day as needed    lisinopriL  (PRINIVIL ) 40 mg Oral Tablet Take 1 Tablet (40 mg total) by mouth Daily     LORazepam  (ATIVAN ) 1 mg Oral Tablet Take 1 Tablet (1 mg total) by mouth Twice per day as needed for Anxiety    metoprolol  succinate (TOPROL -XL) 25 mg Oral Tablet Sustained Release 24 hr Take 1 Tablet (25 mg total) by mouth Daily    Mirtazapine  (REMERON ) 45 mg Oral Tablet Take 1 Tablet (45 mg total) by mouth Every night    omeprazole (PRILOSEC) 40 mg Oral Capsule, Delayed Release(E.C.) Take 1 Capsule (40 mg total) by mouth Daily    ondansetron  (ZOFRAN ) 4 mg Oral Tablet Take 1 Tablet (4 mg total) by mouth Once per day as needed    OXcarbazepine  (TRILEPTAL ) 600 mg Oral Tablet Take 1 Tablet (600 mg total) by mouth    polyethylene glycol 3350  (MIRALAX ORAL) Take by mouth    QUEtiapine  (SEROQUEL ) 50 mg Oral Tablet Take 1 Tablet (50 mg total) by mouth Four times a day Verified dose with patient    rosuvastatin (CRESTOR) 40 mg Oral Tablet Take 1 Tablet (40 mg total) by mouth Every evening     Family Medical History:       Problem Relation (Age of Onset)    Breast Cancer Maternal Aunt (23), Paternal Grandmother    Coronary Artery Disease Other    Elevated Triglycerides Mother    Endometrial cancer Other    Heart Disease Mother, Father    Hypertension (High Blood Pressure) Mother    Lung Cancer Other    Mental illness Other    No Known Problems Sister, Brother, Maternal Uncle, Paternal Aunt, Paternal Uncle, Maternal Grandmother, Maternal Grandfather, Paternal Grandfather, Daughter, Son    Stroke Mother, Father           Social History     Socioeconomic History    Marital status: Divorced   Tobacco Use    Smoking status: Former     Types: Cigarettes    Smokeless tobacco: Never   Vaping Use    Vaping status: Never Used   Substance and Sexual Activity    Alcohol use: Not Currently    Drug use: Never       Objective:  Nursing Notes:   Cammie Schlein, LPN  89/72/74 [id number]  Signed  Patient informed BP elevated to watch at home and remains elevated contact PCP. Schlein Cammie, LPN  89/72/7974 08:45    GAD 7 Total Score             PHQ  Total Score  PHQ 2 Total: 4  PHQ  9 Total: 19  Interpretation of Total Score: 15-19 Moderate/Severe depression         Oswestry Low Back Pain Disability Questionnaire  Oswestry Disability Index    Please complete this questionnaire.  It is designed to tell us  how your back pain affects your ability to function in every day life.    Have you had Chronic Pain or pain that has bothered you for 3 months or more:  I have Chronic Pain or pain that has bothered me for 3 months or more: Yes    Questionnaire filled out:   Questionnaire Completed: Prior to Surgery    Section 1: PAIN INTENSITY  Pain Intensity : 3- The pain is fairly severe at the moment.    Section 2: PERSONAL CARE  Personal Care: 1- I can look after myself normally but it causes extra pain.    Section 3: LIFTING  Lifting: 4- I can lift very light weights.    Section 4: WALKING  Walking: 1- Pain prevents me from walking more than 1 mile.    Section 5: SITTING  Sitting: 2- Pain prevents me sitting more than 1 hour.    Section 6: STANDING  Standing: 3- Pain prevents me from standing for more than 30 minutes.    Section 7: SLEEPING  Sleeping: 2- Because of pain I have less than 6 hours sleep.    Section 8: SOCIAL LIFE  Social Life: 3- Pain has restricted my social life and I do not go out as often.    Section 9: TRAVELING  Traveling: 3- Pain restricts me to journeys of less than 1 hour.    Section 10: EMPLOYMENT/HOMEMAKING  Employment/Homemaking: 2- I can perform most of my homemaking/job duties, but pain prevents me from performing more physically stressful activities (e.g. lifting, vacuuming)        TOTAL SCORE FROM ALL SECTIONS  ODI Total Score Oswestry LBP: 22    Disability Percentage:  ODI %: 44 %        INTERPRETATION:    ODI Scoring:  0% to 20% (minimal disability): Patients can cope with most activities of daily living.  No treatment may be indicated except for suggestions on lifting, posture, physical fitness and diet.  Patients with sedentary  occupations (ex. Secretaries) may experience more problems than others.    21% to 40% (moderate disability):  Patients ma experience more pain and problems with sitting, lifting, and standing.  Travel and social life are more difficult.  Patients may be off work.  Personal care, sleeping and sexual activity may not be grossly affected. Conservative treatment may be sufficient.    41% to 60% (severe disability):  Pain is a primary problem for these patients, but they may also be experiencing significant problems in travel, personal care, social life, sexual activity and sleep.  A detailed evaluation is appropriate.  61% to 80% (crippled):  Back pain has an impact on all aspects of daily living and work.  Active treatment is required.    81%-100% :  These patients may be bed bound or exaggerating their symptoms.  Careful evaluation is recommended.         [place] Pain Rating Scale     On a scale of 0-10, during the past 24 hours, pain has interfered with you usual activity: 4     On a scale of 0-10, during the past 24 hours, pain has interfered with your sleep: 5    On a scale of 0-10, during the past  24 hours, pain has affected your mood: 7     On a scale of 0-10, during the past 24 hours, pain has contributed to your stress: 4     On a scale of 0-10, what is your overall pain Rating: 6         Physical Exam:  Vital Signs:  BP (!) 140/98   Pulse 88   Resp 18   Ht 1.651 m (5' 5)   Wt 91.2 kg (201 lb)   SpO2 94%   BMI 33.45 kg/m       Physical Exam         Gait:  Wide-based gait with short strides bilaterally.  Patient also looks at the floor of the whole time with gait    Posture:     Squat mechanics:  Grade 1 knees forward handhold assist    Scapular mechanics:   Sagittal plane:     Frontal plane:     Transverse plane:    Spinal exam:   Cervical:     Thoracic:     Lumbar:  Flexion is full without pain.  All other planes of motion are full with reproduction of mild low back pain.    Rib/Diaphragm:  Positive apical  expansion bilaterally    Pelvis/Sacrum:  Negative provocative testing for SI joints bilaterally    Hip:  Provocative testing for bilateral hips negative bilaterally    Knee:    Foot/ankle:    Shoulder:    Elbow:    Wrist/hand:    Neuro exam:   Motor:    Cervical:        Lumbar:  5/5 for bilateral L2-S1 myotomes except noted ratcheting weakness in left L4 and L5 myotomes.     Sensory:    Cervical:        Lumbar:  Intact pinprick bilateral L2-S1 dermatomes except for decreased pinprick in the left S1.  Proprioception at the great toe is intact bilaterally.  Vibration sense on the right is 13 seconds on the left is 9 seconds.   Reflex:    Biceps:    Brachioradialis:    Triceps:    Patella:  2+ bilaterally    Medial Hamstring:    Achilles:  0 bilaterally       Hoffman's:  Negative bilaterally    Tromners:    Wartenberg thumb adduction:    Babinski:  Negative bilaterally    Ankle clonus:  Absent bilaterally       Tone:  Normal in all 4 limbs.  Patient does show a clonic type jerking in her lower limbs at rest and with intention.  Patient relates this to her anxiety and states that when her anxiety is worse her jerking is worse.  She states that she has discussed these movements with her psychiatrist.     Rectal:       Special Nerve tests:   Tinels:      Phalen's:     Cervical foraminal traction/compression test:      Upper extremity abduction relief:     Slump:     SLR:     Prone knee bend:     Other:         Osteopathic exam:    Capsular/muscle imbalance:   Tight:     Inhibited:    Physical Exam  Ortho Exam  Objective   Neurological Exam     DIAGNOSTICS:      Results  Images reviewed: y  Results reviewed: n  DOS: 07/22/23  Date of Review: 06/26/24  Study: lumbar mri  L1-L2: nl  L2-L3: nl  L3-L4: nl  L4-L5: min canal stenosis   L5-S1: severe degenerative disc disease + mod-sev bilateral foraminal stenosis + left facet osteoarthritis + min effusion  Additional:      No results found for this or any previous visit  (from the past [id number] hours).               ICD-10-CM    1. Lumbar radiculopathy  M54.16          Assessment & Plan  Lumbar radiculopathy with bilateral leg pain and weakness  Chronic lumbar radiculopathy with bilateral leg pain, more severe on the left. Previous epidural steroid injections provided temporary relief. Physical therapy improved range of motion but not pain. Differential diagnosis includes prefrontal neuropathy due to prediabetes.  - Perform nerve conduction study (EMG) to assess for neuropathy.  - Order lumbar spine x-ray with flexion and extension views to assess for instability.  - Recommend aquatic exercise class for supervised rehabilitation to address balance issues.  - Discuss anti-inflammatory diet with a dietitian as an alternative to diclofenac .  - Consider duloxetine for pain management in consultation with psychiatrist.    Obstructive sleep apnea  Obstructive sleep apnea with previous CPAP use, currently not using CPAP due to anxiety-related issues. Untreated sleep apnea can exacerbate pain symptoms.  - Encourage resumption of CPAP use to improve sleep quality and potentially reduce pain.    Generalized anxiety disorder, Bipolar II disorder, and post-traumatic stress disorder (PTSD)  Well-managed on current psychiatric medications. Anxiety exacerbates physical symptoms. Engaged in counseling at [facility]. Cognitive behavioral therapy for chronic pain is part of the current treatment plan. Discussed potential benefit of duloxetine for combined mood and pain management.  - Continue current psychiatric medications and counseling.  - Discuss potential use of duloxetine with psychiatrist for combined mood and pain management.    Prediabetes  Prediabetes with potential contribution to neuropathic symptoms. No current evidence of significant neuropathy. Emphasized lifestyle modifications to manage prediabetes.     Assessment & Plan  Nerve pain  (Nociplastic v radic v peripheral  neuroapthy)  Lumbar mri images reviewed today  Lumbar xray ordered  EMG bilateral legs  Consider duloxetine for pain relief- will defer to psychiatry regarding this option  Consider AI diet- pt to find a dietician for this  Start exercise class- aquatic based for balance concerns  Consider esi in 3 months as needed       No orders of the defined types were placed in this encounter.        Othniel Maret B Chenille Toor, DO     Portions of this note may be dictated using voice recognition software or a dictation service. Variances in spelling and vocabulary are possible and unintentional. Not all errors are caught/corrected. Please notify the dino if any discrepancies are noted or if the meaning of any statement is not clear.   This note was created with assistance from [employer] via capture of conversational audio. Consent was obtained from the patient and all parties present prior to recording.           [1]   Allergies  Allergen Reactions    Amoxicillin Nausea/ Vomiting    Iron  Nausea/ Vomiting     Iron  pills never tried iv iron 

## 2024-06-26 NOTE — Nursing Note (Signed)
 Patient informed BP elevated to watch at home and remains elevated contact PCP. Kent Pan, LPN  89/72/7974 08:45

## 2024-07-05 ENCOUNTER — Other Ambulatory Visit: Payer: Self-pay

## 2024-07-05 ENCOUNTER — Ambulatory Visit
Admission: RE | Admit: 2024-07-05 | Discharge: 2024-07-05 | Disposition: A | Discharge status: Home / Self Care | Source: Ambulatory Visit

## 2024-07-05 DIAGNOSIS — M5416 Radiculopathy, lumbar region: Secondary | ICD-10-CM | POA: Insufficient documentation

## 2024-07-06 ENCOUNTER — Ambulatory Visit (INDEPENDENT_AMBULATORY_CARE_PROVIDER_SITE_OTHER): Payer: Self-pay

## 2024-07-06 DIAGNOSIS — M5116 Intervertebral disc disorders with radiculopathy, lumbar region: Secondary | ICD-10-CM

## 2024-07-20 ENCOUNTER — Other Ambulatory Visit: Payer: Self-pay

## 2024-07-21 ENCOUNTER — Ambulatory Visit (INDEPENDENT_AMBULATORY_CARE_PROVIDER_SITE_OTHER): Payer: Self-pay

## 2024-07-21 VITALS — BP 137/80 | HR 94 | Resp 16 | Ht 65.0 in | Wt 200.0 lb

## 2024-07-21 DIAGNOSIS — F4310 Post-traumatic stress disorder, unspecified: Secondary | ICD-10-CM

## 2024-07-21 DIAGNOSIS — R7303 Prediabetes: Secondary | ICD-10-CM

## 2024-07-21 DIAGNOSIS — G4733 Obstructive sleep apnea (adult) (pediatric): Secondary | ICD-10-CM

## 2024-07-21 DIAGNOSIS — M9905 Segmental and somatic dysfunction of pelvic region: Secondary | ICD-10-CM

## 2024-07-21 DIAGNOSIS — F3181 Bipolar II disorder: Secondary | ICD-10-CM

## 2024-07-21 DIAGNOSIS — F419 Anxiety disorder, unspecified: Secondary | ICD-10-CM

## 2024-07-21 DIAGNOSIS — R351 Nocturia: Secondary | ICD-10-CM

## 2024-07-21 DIAGNOSIS — M5416 Radiculopathy, lumbar region: Secondary | ICD-10-CM

## 2024-07-21 NOTE — Nursing Note (Signed)
 Patient comes into office today with c/o bilateral leg pain. Patient states she has completed the x-ray and physical therapy. Patient states that physical therapy helped her range of motion but not her pain. Patient is completing walking exercises.     Isaiah Massing, LPN  88/78/7974 10:59

## 2024-07-21 NOTE — Progress Notes (Addendum)
 [facility]  [address]  [place] [address]    History and Physical    Name: Miranda Young MRN:  [id number]   Date: 07/21/2024 DOB:  14-Jun-1976 (48 y.o.)               Provider: Omega KATHEE Sides, DO  PCP: Melissa D Lucado, APRN, CNP  Referring Provider: Omega KATHEE Sides     Reason for visit: No chief complaint on file.      History of Present Illness  06/26/24: Miranda Young is a 48 year old female with prediabetes and chronic back pain who presents for evaluation of leg symptoms and pain management. She was referred by Dr Pierce office for closer management of her leg symptoms and pain relief.    She experiences chronic leg symptoms characterized by nerve pain radiating down to her ankles, with the left leg more affected than the right. The pain is more intense at night, requiring her to hold onto the sheet to turn over due to lower back pain. The nerve pain is constant, with shooting pain occurring when attempting to move without support. Walking exacerbates her symptoms, causing weakness and balance issues.    She has received epidural steroid injections, with the most recent in September providing approximately 70% relief for four weeks, though the initial injection in May provided 80% relief. Physical therapy completed in April improved her range of motion but did not alleviate the leg pain.    She has a history of anxiety, bipolar II disorder, generalized anxiety disorder, and PTSD. She experiences a tremor associated with anxiety, which worsens when her anxiety is heightened. She is stable on her psychiatric medications and has been for four to five years. She attends counseling at [facility] with [employer].    Her past medical history includes a hysterectomy and appendectomy. She is prediabetic and has been diagnosed with sleep apnea, for which she has used a CPAP machine for 10-15 years, though she is currently taking a break due to anxiety-related issues with the  mask. She has a history of using diclofenac  for pain management, which she resumed after discontinuing ibuprofen  post-ankle fracture in July.    She reports a history of balance issues and weakness, which contributed to an ankle fracture in July. She expresses concern about participating in exercise classes due to fear of falling.    07/21/24: pt is here for left > right leg pain.  She has been [facility] 06[facility].  The pool was too warm to tolerate.  Completed PT 11/2023 at [facility].  She has persistent leg pain bilateral despite the 05/2024 ESI by Dr Thurmon.         Past Medical History:  Past Medical History:   Diagnosis Date    Anxiety     Bipolar disorder, unspecified     Depression     Diabetes mellitus, type 2     Esophageal reflux     High triglycerides     HTN (hypertension)     Hypercholesterolemia     Lumbar back pain     2 herniated discs    Sleep apnea     has CPAP     Past Surgical History:   Procedure Laterality Date    ESOPHAGOGASTRODUODENOSCOPY      HX APPENDECTOMY      HX COLONOSCOPY      HX HYSTERECTOMY      HX TUBAL  LIGATION        Allergies[1]  Current Outpatient Medications   Medication Sig    amLODIPine (NORVASC) 10 mg Oral Tablet Take 1 Tablet (10 mg total) by mouth Daily    atorvastatin  (LIPITOR) 80 mg Oral Tablet Take 1 Tablet (80 mg total) by mouth Daily    buPROPion (WELLBUTRIN SR) 150 mg Oral tablet sustained-release 12 hr Take 1 Tablet (150 mg total) by mouth Every morning    busPIRone  (BUSPAR ) 15 mg Oral Tablet Take 1 Tablet (15 mg total) by mouth Three times a day    diclofenac  sodium (VOLTAREN ) 75 mg Oral Tablet, Delayed Release (E.C.) Take 1 Tablet (75 mg total) by mouth Three times a day Dose verified with patient    ergocalciferol, vitamin D2, (DRISDOL) 1,250 mcg (50,000 unit) Oral Capsule Take 1 Capsule (50,000 Units total) by mouth Every 7 days    furosemide (LASIX) 40 mg Oral Tablet Take 1 Tablet (40 mg total) by mouth Daily     hydrOXYzine  HCL (ATARAX ) 50 mg Oral Tablet Take 1 Tablet (50 mg total) by mouth Four times a day    Ibuprofen  (MOTRIN ) 200 mg Oral Tablet Take 4 Tablets (800 mg total) by mouth Three times a day as needed    lisinopriL  (PRINIVIL ) 40 mg Oral Tablet Take 1 Tablet (40 mg total) by mouth Daily    LORazepam  (ATIVAN ) 1 mg Oral Tablet Take 1 Tablet (1 mg total) by mouth Twice per day as needed for Anxiety    metoprolol  succinate (TOPROL -XL) 25 mg Oral Tablet Sustained Release 24 hr Take 1 Tablet (25 mg total) by mouth Daily    Mirtazapine  (REMERON ) 45 mg Oral Tablet Take 1 Tablet (45 mg total) by mouth Every night    omeprazole (PRILOSEC) 40 mg Oral Capsule, Delayed Release(E.C.) Take 1 Capsule (40 mg total) by mouth Daily    ondansetron  (ZOFRAN ) 4 mg Oral Tablet Take 1 Tablet (4 mg total) by mouth Once per day as needed    OXcarbazepine  (TRILEPTAL ) 600 mg Oral Tablet Take 1 Tablet (600 mg total) by mouth    polyethylene glycol 3350  (MIRALAX ORAL) Take by mouth    QUEtiapine  (SEROQUEL ) 50 mg Oral Tablet Take 1 Tablet (50 mg total) by mouth Four times a day Verified dose with patient    rosuvastatin (CRESTOR) 40 mg Oral Tablet Take 1 Tablet (40 mg total) by mouth Every evening     Family Medical History:       Problem Relation (Age of Onset)    Breast Cancer Maternal Aunt (23), Paternal Grandmother    Coronary Artery Disease Other    Elevated Triglycerides Mother    Endometrial cancer Other    Heart Disease Mother, Father    Hypertension (High Blood Pressure) Mother    Lung Cancer Other    Mental illness Other    No Known Problems Sister, Brother, Maternal Uncle, Paternal Aunt, Paternal Uncle, Maternal Grandmother, Maternal Grandfather, Paternal Grandfather, Daughter, Son    Stroke Mother, Father           Social History     Socioeconomic History    Marital status: Divorced   Tobacco Use    Smoking status: Former     Types: Cigarettes    Smokeless tobacco: Never   Vaping Use    Vaping status: Never Used   Substance and  Sexual Activity    Alcohol use: Not Currently    Drug use: Never       Objective:  There  are no exam notes on file for this visit.    GAD 7 Total Score             PHQ Total Score                  Oswestry Low Back Pain Disability Questionnaire  Oswestry Disability Index    Please complete this questionnaire.  It is designed to tell us  how your back pain affects your ability to function in every day life.    Have you had Chronic Pain or pain that has bothered you for 3 months or more:       Questionnaire filled out:        Section 1: PAIN INTENSITY       Section 2: PERSONAL CARE       Section 3: LIFTING       Section 4: WALKING       Section 5: SITTING       Section 6: STANDING       Section 7: SLEEPING       Section 8: SOCIAL LIFE       Section 9: TRAVELING       Section 10: EMPLOYMENT/HOMEMAKING           TOTAL SCORE FROM ALL SECTIONS       Disability Percentage:    %        INTERPRETATION:    ODI Scoring:  0% to 20% (minimal disability): Patients can cope with most activities of daily living.  No treatment may be indicated except for suggestions on lifting, posture, physical fitness and diet.  Patients with sedentary occupations (ex. Secretaries) may experience more problems than others.    21% to 40% (moderate disability):  Patients ma experience more pain and problems with sitting, lifting, and standing.  Travel and social life are more difficult.  Patients may be off work.  Personal care, sleeping and sexual activity may not be grossly affected. Conservative treatment may be sufficient.    41% to 60% (severe disability):  Pain is a primary problem for these patients, but they may also be experiencing significant problems in travel, personal care, social life, sexual activity and sleep.  A detailed evaluation is appropriate.  61% to 80% (crippled):  Back pain has an impact on all aspects of daily living and work.  Active treatment is required.    81%-100% :  These patients may be bed bound or exaggerating  their symptoms.  Careful evaluation is recommended.         Quinter Pain Rating Scale     On a scale of 0-10, during the past 24 hours, pain has interfered with you usual activity:       On a scale of 0-10, during the past 24 hours, pain has interfered with your sleep:      On a scale of 0-10, during the past 24 hours, pain has affected your mood:       On a scale of 0-10, during the past 24 hours, pain has contributed to your stress:       On a scale of 0-10, what is your overall pain Rating:           Physical Exam:  Vital Signs:  There were no vitals taken for this visit.      Physical Exam         Gait:  Wide-based gait with short strides bilaterally.  Patient also looks at the floor of the  whole time with gait    Posture:     Squat mechanics:  Grade 1 knees forward handhold assist    Scapular mechanics:   Sagittal plane:     Frontal plane:     Transverse plane:    Spinal exam:   Cervical:     Thoracic:     Lumbar:  Flexion is full without pain.  All other planes of motion are full with reproduction of mild low back pain.    Rib/Diaphragm:  Positive apical expansion bilaterally    Pelvis/Sacrum:  Negative provocative testing for SI joints bilaterally    Hip:  Provocative testing for bilateral hips negative bilaterally    Knee:    Foot/ankle:    Shoulder:    Elbow:    Wrist/hand:    Neuro exam:   Motor:    Cervical:        Lumbar:  5/5 for bilateral L2-S1 myotomes except noted ratcheting weakness in left L4 and L5 myotomes.     Sensory:    Cervical:        Lumbar:  Intact pinprick bilateral L2-S1 dermatomes except for decreased pinprick in the left S1.  Proprioception at the great toe is intact bilaterally.  Vibration sense on the right is 13 seconds on the left is 9 seconds.   Reflex:    Biceps:    Brachioradialis:    Triceps:    Patella:  2+ bilaterally    Medial Hamstring:    Achilles:  0 bilaterally       Hoffman's:  Negative bilaterally    Tromners:    Wartenberg thumb adduction:    Babinski:  Negative  bilaterally    Ankle clonus:  Absent bilaterally       Tone:  Normal in all 4 limbs.  Patient does show a clonic type jerking in her lower limbs at rest and with intention.  Patient relates this to her anxiety and states that when her anxiety is worse her jerking is worse.  She states that she has discussed these movements with her psychiatrist.     Rectal:       Special Nerve tests:   Tinels:      Phalen's:     Cervical foraminal traction/compression test:      Upper extremity abduction relief:     Slump:     SLR:     Prone knee bend:     Other:         Osteopathic exam:    Capsular/muscle imbalance:   Tight:     Inhibited:    Physical Exam  Ortho Exam  Objective   Neurological Exam     DIAGNOSTICS:      Results         Images reviewed: y  Results reviewed: n  DOS: 07/22/23  Date of Review: 06/26/24  Study: lumbar mri  L1-L2: nl  L2-L3: nl  L3-L4: nl  L4-L5: min canal stenosis   L5-S1: severe degenerative disc disease + mod-sev bilateral foraminal stenosis + left facet osteoarthritis + min effusion  Additional:      No results found for this or any previous visit (from the past [id number] hours).             No diagnosis found.     Assessment & Plan  Electrodiagnostic evidence of denervation in left L4-s1 muscles (radic v diffuse process v technical error).  This finding is not explained well by recent lumbar mri.  Consider repeat EMG  to r/o diffuse process (ex: MND) if unchanged left > right leg pain.    Lumbar radiculopathy with bilateral leg pain and weakness  Left L4-s1 acute radiculpathy on EMG 07/21/24  Chronic lumbar radiculopathy with bilateral leg pain, more severe on the left. Previous epidural steroid injections provided temporary relief. Physical therapy improved range of motion but not pain.   - Perform nerve conduction study (EMG) -done- see report 07/21/24  - Order lumbar spine x-ray - images/report reviewed on 07/21/24  - cont HEP; pt also to start Pelvic PT  -lumbar mri open- request auth  - Discuss  anti-inflammatory diet with a dietitian as an alternative to diclofenac .  - Consider duloxetine for pain management in consultation with psychiatrist.    Obstructive sleep apnea  Obstructive sleep apnea with previous CPAP use, currently not using CPAP due to anxiety-related issues. Untreated sleep apnea can exacerbate pain symptoms.  - pt will see Dr Ezzard , sleep specialist, Pulmonologist, for mask adaptations to increase tolerance- Dec 8.  If no improvement with new mask, consider CBT.    Generalized anxiety disorder, Bipolar II disorder, and post-traumatic stress disorder (PTSD)  Well-managed on current psychiatric medications. Anxiety exacerbates physical symptoms. Engaged in counseling at [facility]. Cognitive behavioral therapy for chronic pain is part of the current treatment plan. Discussed potential benefit of duloxetine for combined mood and pain management.  - Continue current psychiatric medications and counseling.  - Discuss potential use of duloxetine with psychiatrist for combined mood and pain management.- pending 08/14/24    Prediabetes  Prediabetes with potential contribution to neuropathic symptoms. No current evidence of significant neuropathy. Emphasized lifestyle modifications to manage prediabetes.    Pelvic floor dysfxn/nocturia  -start pelvic PT- pt agreed today     Assessment & Plan         No orders of the defined types were placed in this encounter.        Pavneet Markwood B Karsyn Jamie, DO     Portions of this note may be dictated using voice recognition software or a dictation service. Variances in spelling and vocabulary are possible and unintentional. Not all errors are caught/corrected. Please notify the dino if any discrepancies are noted or if the meaning of any statement is not clear.   This note was created with assistance from [employer] via capture of conversational audio. Consent was obtained from the patient and all parties present prior to recording.             [1]   Allergies  Allergen  Reactions    Amoxicillin Nausea/ Vomiting    Iron  Nausea/ Vomiting     Iron  pills never tried iv iron 

## 2024-07-31 ENCOUNTER — Other Ambulatory Visit (INDEPENDENT_AMBULATORY_CARE_PROVIDER_SITE_OTHER): Payer: Self-pay

## 2024-07-31 DIAGNOSIS — M5416 Radiculopathy, lumbar region: Secondary | ICD-10-CM

## 2024-07-31 NOTE — Addendum Note (Signed)
 Addended by: Syed Zukas FAITH on: 07/31/2024 08:42 AM     Modules accepted: Orders

## 2024-08-08 ENCOUNTER — Other Ambulatory Visit: Payer: Self-pay

## 2024-08-09 ENCOUNTER — Other Ambulatory Visit (INDEPENDENT_AMBULATORY_CARE_PROVIDER_SITE_OTHER): Payer: Self-pay

## 2024-08-09 ENCOUNTER — Ambulatory Visit: Admission: RE | Admit: 2024-08-09 | Discharge: 2024-08-09

## 2024-08-09 DIAGNOSIS — M5416 Radiculopathy, lumbar region: Secondary | ICD-10-CM | POA: Insufficient documentation

## 2024-08-09 MED ORDER — GADOBUTROL 10 MMOL/10 ML (1 MMOL/ML) INTRAVENOUS SOLUTION
10.0000 mL | INTRAVENOUS | Status: DC
  Administered 2024-08-09: 0 mL via INTRAVENOUS

## 2024-08-10 DIAGNOSIS — M2578 Osteophyte, vertebrae: Secondary | ICD-10-CM

## 2024-08-10 DIAGNOSIS — M4807 Spinal stenosis, lumbosacral region: Secondary | ICD-10-CM

## 2024-08-10 DIAGNOSIS — M48061 Spinal stenosis, lumbar region without neurogenic claudication: Secondary | ICD-10-CM

## 2024-09-06 ENCOUNTER — Ambulatory Visit (HOSPITAL_COMMUNITY): Payer: Self-pay

## 2024-09-13 ENCOUNTER — Other Ambulatory Visit: Payer: Self-pay

## 2024-09-14 ENCOUNTER — Ambulatory Visit (INDEPENDENT_AMBULATORY_CARE_PROVIDER_SITE_OTHER): Payer: Self-pay

## 2024-09-14 ENCOUNTER — Telehealth (INDEPENDENT_AMBULATORY_CARE_PROVIDER_SITE_OTHER): Payer: Self-pay

## 2024-09-14 ENCOUNTER — Encounter (INDEPENDENT_AMBULATORY_CARE_PROVIDER_SITE_OTHER): Payer: Self-pay

## 2024-09-14 VITALS — Ht 65.0 in | Wt 200.0 lb

## 2024-09-14 DIAGNOSIS — M4316 Spondylolisthesis, lumbar region: Secondary | ICD-10-CM

## 2024-09-14 DIAGNOSIS — M48061 Spinal stenosis, lumbar region without neurogenic claudication: Secondary | ICD-10-CM

## 2024-09-14 DIAGNOSIS — M5416 Radiculopathy, lumbar region: Secondary | ICD-10-CM

## 2024-09-14 NOTE — Nursing Note (Signed)
 Pt has arrived to clinic for follow up on bilateral leg pain.  Declined Pelvic Therapy per pt least of worries, can hardly walk. Per Pt psychiatrist recommenced not starting Duloxetine right now wants to slowly introduce that medication. Has decreased mirtazapine  for now down to 30 mg. Pt rates pan 8/10 today.    [place], [place]  09/14/2024 09:09

## 2024-09-14 NOTE — Progress Notes (Signed)
 [facility]  [address]  [place] [address]    History and Physical    Name: Miranda Young MRN:  [id number]   Date: 09/14/2024 DOB:  [birth year] (49 y.o.)               Provider: Omega KATHEE Sides, DO  PCP: Melissa D Lucado, APRN, CNP  Referring Provider: Omega KATHEE Sides     Reason for visit: Leg Pain (Bilateral)      History of Present Illness  06/26/24: Miranda Young is a 49 year old female with prediabetes and chronic back pain who presents for evaluation of leg symptoms and pain management. She was referred by Dr Pierce office for closer management of her leg symptoms and pain relief.    She experiences chronic leg symptoms characterized by nerve pain radiating down to her ankles, with the left leg more affected than the right. The pain is more intense at night, requiring her to hold onto the sheet to turn over due to lower back pain. The nerve pain is constant, with shooting pain occurring when attempting to move without support. Walking exacerbates her symptoms, causing weakness and balance issues.    She has received epidural steroid injections, with the most recent in September providing approximately 70% relief for four weeks, though the initial injection in May provided 80% relief. Physical therapy completed in April improved her range of motion but did not alleviate the leg pain.    She has a history of anxiety, bipolar II disorder, generalized anxiety disorder, and PTSD. She experiences a tremor associated with anxiety, which worsens when her anxiety is heightened. She is stable on her psychiatric medications and has been for four to five years. She attends counseling at [facility] with [employer].    Her past medical history includes a hysterectomy and appendectomy. She is prediabetic and has been diagnosed with sleep apnea, for which she has used a CPAP machine for 10-15 years, though she is currently taking a break due to anxiety-related issues with the mask. She  has a history of using diclofenac  for pain management, which she resumed after discontinuing ibuprofen  post-ankle fracture in July.    She reports a history of balance issues and weakness, which contributed to an ankle fracture in July. She expresses concern about participating in exercise classes due to fear of falling.    07/21/24: pt is here for left > right leg pain.  She has been walking 12 laps around [facility] [facility] since 06/27/24.  The pool was too warm to tolerate.  Completed PT 11/2023 at [facility].  She has persistent leg pain bilateral despite the 05/2024 ESI by Dr Thurmon.    09/14/24: pt is here for left > right leg pain.  Pt states she did not start pelvic PT because she can hardly walk and [PT] is the least of my worries.  Dr Hortencia, psychiatry reduced mirtazapine  and did not want to start duloxetine after titrating off mirtazapine .  Right > left leg pain worse with walking.  The right leg pain feels like muscle pain in the posterior thigh.  The left lower limb symptoms are from the knee pain.            Physical Exam:  Vital Signs:  There were no vitals taken for this visit.      Physical Exam         Gait:  Wide-based gait with short strides bilaterally.  Patient also looks  at the floor of the whole time with gait    Posture:     Squat mechanics:  Grade 1 knees forward handhold assist    Scapular mechanics:   Sagittal plane:     Frontal plane:     Transverse plane:    Spinal exam:   Cervical:     Thoracic:     Lumbar:  Flexion is full without pain.  All other planes of motion are full with reproduction of mild low back pain.    Rib/Diaphragm:  Positive apical expansion bilaterally    Pelvis/Sacrum:  Negative provocative testing for SI joints bilaterally    Hip:  Provocative testing for bilateral hips negative bilaterally    Knee:    Foot/ankle:    Shoulder:    Elbow:    Wrist/hand:    Neuro exam: pt emotional and tearful during exam.  Avoids eye contact with this  provider   Motor:    Cervical:        Lumbar:  5/5 for bilateral L2-S1 myotomes except noted ratcheting weakness in left L4 and L5 myotomes.     Sensory:    Cervical:        Lumbar:  Intact pinprick bilateral L2-S1 dermatomes except for decreased pinprick in the left S1.  Proprioception at the great toe is intact bilaterally.  Vibration sense on the right is 13 seconds on the left is 9 seconds.   Reflex:    Biceps:    Brachioradialis:    Triceps:    Patella:  2+ bilaterally    Medial Hamstring:    Achilles:  0 bilaterally       Hoffman's:  Negative bilaterally    Tromners:    Wartenberg thumb adduction:    Babinski:  Negative bilaterally    Ankle clonus:  Absent bilaterally       Tone:  Normal in all 4 limbs.  Patient does show a clonic type jerking in her lower limbs at rest and with intention.  Patient relates this to her anxiety and states that when her anxiety is worse her jerking is worse.  She states that she has discussed these movements with her psychiatrist.     Rectal:       Special Nerve tests:   Tinels:      Phalen's:     Cervical foraminal traction/compression test:      Upper extremity abduction relief:     Slump:     SLR:     Prone knee bend:     Other:         Osteopathic exam:    Capsular/muscle imbalance:   Tight:     Inhibited:    Physical Exam  Ortho Exam  Objective   Neurological Exam     DIAGNOSTICS:      Results         Images reviewed: y  Results reviewed: n  DOS: 07/22/23  Date of Review: 06/26/24  Study: lumbar mri  L1-L2: nl  L2-L3: nl  L3-L4: nl  L4-L5: min canal stenosis   L5-S1: severe degenerative disc disease + mod-sev bilateral foraminal stenosis + left facet osteoarthritis + min effusion  Additional:      Recent Results (from the past [id number] hours)   MRI SPINE LUMBOSACRAL WO CONTRAST    Collection Time: 08/09/24  6:01 PM    Narrative    JACE RAMAN Nembhard    RADIOLOGIST: Oneil LITTIE Raw, MD    MRI SPINE LUMBOSACRAL WO CONTRAST  performed on 08/09/2024 6:01 PM    CLINICAL HISTORY: M54.16:  Chronic lumbar radiculopathy.  CHRONIC LOW BACK PAIN, BILATERAL LEG PAIN, WEAKNESS, NUMBNESS, PT REFUSED CONTRAST AFTER PRE IMAGES    TECHNIQUE:  Noncontrast lumbar spine MRI.    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  Vertebrae:  Lumbar vertebral body heights are preserved.   Bone marrow signal is unremarkable.    Alignment:  Normal.  No spondylolisthesis.    Conus Medullaris:  Normally positioned.    L1-2:  Unremarkable    L2-3:  Degenerative disc disease with loss of intervertebral disc height. There is an anterior bridging osteophyte. There is a broad-based disc bulge. When superimposed upon a congenitally small spinal canal, this results in moderate spinal stenosis. No significant neural foraminal narrowing.    L3-4:  Bilateral facet joint DJD right slightly worse than left. Mild disc bulging. When superimposed upon a congenitally small spinal canal, there is moderate spinal stenosis. No neural foraminal narrowing.    L4-5:  Bilateral facet joint DJD with disc bulging. There is an annular tear through the posterior aspect of the annulus fibrosus. Moderate spinal stenosis with congenitally small spinal canal. No neural foraminal narrowing.    L5-S1:  Degenerative disc disease with desiccation intervertebral disc and loss of disc height. There is a posterior disc osteophyte complex with bilateral facet joint DJD. Moderate right neural foraminal narrowing. The disc ossify complex abuts the exiting right L5 nerve root.    Sacrum:  Visualized upper sacrum and SI joints are unremarkable.    When compared the prior MRI of 07/22/2023, the current findings are similar without significant change.      Impression    1. 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 MODERATE SPINAL STENOSIS L2-3, L3-4, AND L4-5  3. MODERATE RIGHT NEURAL FORAMINAL NARROWING L5-S1 WITH DISC OSTEOPHYTE COMPLEX ABUTS THE EXITING RIGHT L5 NERVE ROOT.        Radiologist location ID:  [id number]                  No diagnosis found.     Assessment & Plan  Electrodiagnostic evidence of denervation in left L4-s1 muscles (radic v diffuse process v technical error).  This finding is not explained well by recent lumbar mri.  Consider repeat EMG to r/o diffuse process (ex: MND) if unchanged left > right leg pain.  Pt now voices right > left leg pain (09/14/24) that does correlate with recent mri  - pending EMG 10/13/24.  If persistent denervation outside of L5, then refer to neurology to r/o diffuse process    Lumbar radiculopathy with bilateral leg pain and weakness  Left L4-s1 acute radiculpathy on EMG 07/21/24 + bilateral L5-s1 foraminal stenosis + grade I retrolisthesis  Chronic lumbar radiculopathy with bilateral leg pain, more severe on the right. Previous epidural steroid injections provided temporary relief. Physical therapy improved range of motion but not pain.   - cont HEP; pt refuses PT due to she can hardly walk and PT is the least of her worries  - Discuss anti-inflammatory diet with a dietitian as an alternative to diclofenac .-pt consulted with exercise physiologist at work and is following this providers recommendations for high protein.  - Consider duloxetine for pain management in consultation with psychiatrist.  - will request esi at bilateral L5 tfesi, per pt request even though last ESI only helped 70% x4 weeks.  -f/u 4 weeks  - if  no improvement, then refer to [facility] for 2nd opinion    Obstructive sleep apnea  Obstructive sleep apnea with previous CPAP use, currently not using CPAP due to anxiety-related issues. Untreated sleep apnea can exacerbate pain symptoms.  - pt will see Dr Ezzard , sleep specialist, Pulmonologist, for mask adaptations to increase tolerance- 11/23/24.  If no improvement with new mask, consider CBT.    Generalized anxiety disorder, Bipolar II disorder, and post-traumatic stress disorder (PTSD)  Well-managed on current psychiatric medications. Anxiety  exacerbates physical symptoms. Engaged in counseling at [facility]. Cognitive behavioral therapy for chronic pain is part of the current treatment plan. Discussed potential benefit of duloxetine for combined mood and pain management.  - Continue current psychiatric medications and counseling.  - Discuss potential use of duloxetine with psychiatrist for combined mood and pain management.- pending 08/14/24    Prediabetes  Prediabetes with potential contribution to neuropathic symptoms. No current evidence of significant neuropathy. Emphasized lifestyle modifications to manage prediabetes.    Pelvic floor dysfxn/nocturia  -start pelvic PT- pt not interested.     Assessment & Plan         No orders of the defined types were placed in this encounter.        Alroy Portela B Panzy Bubeck, DO     Portions of this note may be dictated using voice recognition software or a dictation service. Variances in spelling and vocabulary are possible and unintentional. Not all errors are caught/corrected. Please notify the dino if any discrepancies are noted or if the meaning of any statement is not clear.   This note was created with assistance from [employer] via capture of conversational audio. Consent was obtained from the patient and all parties present prior to recording.

## 2024-09-14 NOTE — Nursing Note (Signed)
 Patient was called and pre injections form was completed over the phone for injection.    Isaiah Massing, LPN  8/84/7973 85:71

## 2024-09-21 ENCOUNTER — Telehealth (INDEPENDENT_AMBULATORY_CARE_PROVIDER_SITE_OTHER): Payer: Self-pay

## 2024-09-21 NOTE — Telephone Encounter (Signed)
 Called pt and and scheduled procedure for 09-28-24 10:30 for 11:00 with a driver.

## 2024-09-26 ENCOUNTER — Telehealth (HOSPITAL_COMMUNITY): Payer: Self-pay

## 2024-09-26 NOTE — OR PreOp (Signed)
 Spoke with pt regarding injection.  Pt states she called the office to cancel due to the flu.

## 2024-09-27 ENCOUNTER — Telehealth (INDEPENDENT_AMBULATORY_CARE_PROVIDER_SITE_OTHER): Payer: Self-pay

## 2024-09-27 NOTE — Telephone Encounter (Signed)
 Left message to please return phone call to reschedule injection.

## 2024-09-28 ENCOUNTER — Emergency Department (HOSPITAL_COMMUNITY)

## 2024-09-28 ENCOUNTER — Emergency Department: Admission: EM | Admit: 2024-09-28 | Discharge: 2024-09-28 | Disposition: A | Discharge status: Home / Self Care

## 2024-09-28 ENCOUNTER — Encounter (HOSPITAL_COMMUNITY): Payer: Self-pay

## 2024-09-28 ENCOUNTER — Ambulatory Visit: Admit: 2024-09-28

## 2024-09-28 ENCOUNTER — Other Ambulatory Visit: Payer: Self-pay

## 2024-09-28 DIAGNOSIS — J189 Pneumonia, unspecified organism: Secondary | ICD-10-CM

## 2024-09-28 DIAGNOSIS — J1000 Influenza due to other identified influenza virus with unspecified type of pneumonia: Secondary | ICD-10-CM | POA: Insufficient documentation

## 2024-09-28 DIAGNOSIS — R059 Cough, unspecified: Secondary | ICD-10-CM

## 2024-09-28 DIAGNOSIS — E871 Hypo-osmolality and hyponatremia: Secondary | ICD-10-CM | POA: Insufficient documentation

## 2024-09-28 DIAGNOSIS — J101 Influenza due to other identified influenza virus with other respiratory manifestations: Secondary | ICD-10-CM

## 2024-09-28 DIAGNOSIS — R509 Fever, unspecified: Secondary | ICD-10-CM

## 2024-09-28 LAB — LACTIC ACID LEVEL W/ REFLEX FOR LEVEL >2.0: LACTIC ACID: 1.2 mmol/L (ref 0.5–2.2)

## 2024-09-28 LAB — URINALYSIS, MACROSCOPIC
BILIRUBIN: NEGATIVE mg/dL
BLOOD: NEGATIVE mg/dL
GLUCOSE: NEGATIVE mg/dL
KETONES: NEGATIVE mg/dL
LEUKOCYTE ESTERASE: NEGATIVE WBCs/uL
NITRITE: NEGATIVE
PH: 7.5 (ref 5.0–8.0)
PROTEIN: NEGATIVE mg/dL
SPECIFIC GRAVITY: 1.008 (ref 1.005–1.030)
UROBILINOGEN: NEGATIVE mg/dL

## 2024-09-28 LAB — COMPREHENSIVE METABOLIC PANEL, NON-FASTING
ALBUMIN/GLOBULIN RATIO: 1.5 — ABNORMAL HIGH (ref 0.8–1.4)
ALBUMIN: 4.4 g/dL (ref 3.5–5.7)
ALKALINE PHOSPHATASE: 59 U/L (ref 34–104)
ALT (SGPT): 39 U/L (ref 7–52)
ANION GAP: 7 mmol/L (ref 4–13)
AST (SGOT): 31 U/L (ref 13–39)
BILIRUBIN TOTAL: 0.3 mg/dL (ref 0.3–1.0)
BUN/CREA RATIO: 10 (ref 6–22)
BUN: 8 mg/dL (ref 7–25)
CALCIUM, CORRECTED: 8.6 mg/dL — ABNORMAL LOW (ref 8.9–10.8)
CALCIUM: 8.9 mg/dL (ref 8.6–10.3)
CHLORIDE: 98 mmol/L (ref 98–107)
CO2 TOTAL: 28 mmol/L (ref 21–31)
CREATININE: 0.84 mg/dL (ref 0.60–1.30)
ESTIMATED GFR: 85 mL/min/{1.73_m2} (ref >59)
GLOBULIN: 3 (ref 2.0–3.5)
GLUCOSE: 136 mg/dL — ABNORMAL HIGH (ref 74–109)
OSMOLALITY, CALCULATED: 267 mosm/kg — ABNORMAL LOW (ref 270–290)
POTASSIUM: 4.7 mmol/L (ref 3.5–5.1)
PROTEIN TOTAL: 7.4 g/dL (ref 6.4–8.9)
SODIUM: 133 mmol/L — ABNORMAL LOW (ref 136–145)

## 2024-09-28 LAB — CBC WITH DIFF
BASOPHIL #: 0 10*3/uL (ref 0.00–0.10)
BASOPHIL %: 1 % (ref 0–1)
EOSINOPHIL #: 0 10*3/uL (ref 0.00–0.50)
EOSINOPHIL %: 0 % — ABNORMAL LOW (ref 1–7)
HCT: 36.8 % (ref 31.2–41.9)
HGB: 12.6 g/dL (ref 10.9–14.3)
LYMPHOCYTE #: 0.5 10*3/uL — ABNORMAL LOW (ref 1.10–3.10)
LYMPHOCYTE %: 14 % — ABNORMAL LOW (ref 16–46)
MCH: 28.8 pg (ref 24.7–32.8)
MCHC: 34.2 g/dL (ref 32.3–35.6)
MCV: 84.3 fL (ref 75.5–95.3)
MONOCYTE #: 0.1 10*3/uL — ABNORMAL LOW (ref 0.20–0.90)
MONOCYTE %: 3 % — ABNORMAL LOW (ref 4–11)
MPV: 7.8 fL — ABNORMAL LOW (ref 7.9–10.8)
NEUTROPHIL #: 3.1 10*3/uL (ref 1.90–8.20)
NEUTROPHIL %: 83 % — ABNORMAL HIGH (ref 43–77)
PLATELETS: 202 10*3/uL (ref 140–440)
RBC: 4.37 10*6/uL (ref 3.63–4.92)
RDW: 13.2 % (ref 12.3–17.7)
WBC: 3.7 10*3/uL — ABNORMAL LOW (ref 3.8–11.8)

## 2024-09-28 LAB — COVID-19, FLU A/B, RSV RAPID BY PCR
INFLUENZA VIRUS TYPE A: DETECTED — AB
INFLUENZA VIRUS TYPE B: NOT DETECTED
RESPIRATORY SYNCYTIAL VIRUS (RSV): NOT DETECTED
SARS-CoV-2: NOT DETECTED

## 2024-09-28 LAB — PT/INR
INR: 1.01 (ref 0.84–1.10)
PROTHROMBIN TIME: 11.4 s (ref 9.8–12.7)

## 2024-09-28 LAB — URINALYSIS, MICROSCOPIC

## 2024-09-28 LAB — MAGNESIUM: MAGNESIUM: 1.7 mg/dL — ABNORMAL LOW (ref 1.9–2.7)

## 2024-09-28 LAB — TROPONIN-I: TROPONIN I: 3 ng/L (ref <15)

## 2024-09-28 LAB — PTT (PARTIAL THROMBOPLASTIN TIME): APTT: 26.5 s (ref 25.0–38.0)

## 2024-09-28 MED ORDER — MAGNESIUM OXIDE 400 MG (241.3 MG MAGNESIUM) TABLET
ORAL_TABLET | ORAL | Status: AC
  Filled 2024-09-28: qty 1

## 2024-09-28 MED ORDER — CEFTRIAXONE 2 GRAM SOLUTION FOR INJECTION
INTRAMUSCULAR | Status: AC
  Filled 2024-09-28: qty 20

## 2024-09-28 MED ORDER — MAGNESIUM OXIDE 400 MG (241.3 MG MAGNESIUM) TABLET
400.0000 mg | ORAL_TABLET | ORAL | Status: AC
  Administered 2024-09-28: 400 mg via ORAL

## 2024-09-28 MED ORDER — SODIUM CHLORIDE 0.9 % IV BOLUS
1000.0000 mL | INJECTION | Status: AC
  Administered 2024-09-28: 0 mL via INTRAVENOUS
  Administered 2024-09-28: 1000 mL via INTRAVENOUS

## 2024-09-28 MED ORDER — ALBUTEROL SULFATE HFA 90 MCG/ACTUATION AEROSOL INHALER: 1.0000 | INHALATION_SPRAY | Freq: Four times a day (QID) | RESPIRATORY_TRACT | 0 refills | Status: AC | PRN

## 2024-09-28 MED ORDER — SODIUM CHLORIDE 0.9 % INTRAVENOUS SOLUTION
2.0000 g | INTRAVENOUS | Status: AC
  Administered 2024-09-28: 2 g via INTRAVENOUS
  Administered 2024-09-28: 0 g via INTRAVENOUS

## 2024-09-28 MED ORDER — CEFDINIR 300 MG CAPSULE: 300.0000 mg | ORAL_CAPSULE | Freq: Two times a day (BID) | ORAL | 0 refills | Status: AC

## 2024-09-28 MED ORDER — SODIUM CHLORIDE 0.9 % INTRAVENOUS SOLUTION
INTRAVENOUS | Status: AC
  Filled 2024-09-28: qty 50

## 2024-09-28 NOTE — ED Triage Notes (Signed)
 Sent by urgent care for dizziness after duoneb and solu-medrol  IM. Pt presented there for congestion, cough for the past couple of days. Did note pneumonia on xray. Negative covid     PRS: BS 125

## 2024-09-28 NOTE — Discharge Instructions (Addendum)
 Take cefdinir  as directed.  Use albuterol  inhaler as directed.  Please follow up with PCP in the next 3-5 days.  Return to the ER for any new or worsening symptoms

## 2024-09-28 NOTE — ED Provider Notes (Cosign Needed)
 [facility] - Emergency Department  ED Primary Note  History of Present Illness  Miranda Young is a 49 year old female who presents with severe dizziness.    She has been experiencing severe dizziness since Sunday, which worsened significantly by yesterday evening. She reports dizziness when getting up and moving, and states it was severe enough that she almost passed out. No chest pain, shortness of breath, or palpitations.    She had a fever that resolved by Wednesday morning. She denies any urinary complaints, such as burning during urination. She was tested for COVID, which was negative, and was found to have pneumonia in the left lower lung at [facility]. They gave her duoneb and solumedrol PTA.     She has a history of low sodium and experienced diarrhea for three days, which she mentioned might be related to her current condition. She takes various daily medications, which are filled at [facility].  Physical Exam   ED Triage Vitals [09/28/24 1151]   BP (Non-Invasive) 130/84   Heart Rate 92   Respiratory Rate 18   Temperature 36.2 C (97.1 F)   SpO2 94 %   Weight 90.7 kg (200 lb)   Height 1.651 m (5' 5)     Physical Exam  Nursing notes and vital signs reviewed.     Constitutional - No acute distress.  Alert and Active.  HEENT - Normocephalic. Atraumatic. PERRL. EOMI. Conjunctiva clear. Moist mucous membranes.   Neck - Trachea midline. No stridor. No hoarseness.  Cardiac - Regular rate and rhythm. No murmurs, rubs, or gallops. Intact distal pulses.  Respiratory/Chest - Normal respiratory effort.  Diminished on the left.  Abdomen - Normal bowel sounds. Non-tender, soft, non-distended. No rebound or guarding.   Musculoskeletal - Good AROM. No muscle or joint tenderness appreciated. No clubbing, cyanosis or edema.  Skin - Warm and dry, without any rashes or other lesions.  Neuro - Alert and oriented x 3. Cranial nerves II-XII are grossly intact.  Moving all extremities symmetrically. Normal  gait.  Psych - Normal mood and affect. Behavior is normal    Patient Data   Results  Labs  CMP (Thursday September 28, 2024): Bilirubin 1.33  Influenza A (Thursday September 28, 2024): Positive  Urinalysis (Thursday September 28, 2024): Within normal limits  Magnesium  (Thursday September 28, 2024): 1.7  Gelmix (Thursday September 28, 2024): Negative    Radiology  Chest X-ray (Thursday September 28, 2024): Left basilar focal opacity    Labs Ordered/Reviewed   COVID-19, FLU A/B, RSV RAPID BY PCR - Abnormal; Notable for the following components:       Result Value    INFLUENZA VIRUS TYPE A Detected (*)     All other components within normal limits    Narrative:     Results are for the simultaneous qualitative identification of SARS-CoV-2 (formerly 2019-nCoV), Influenza A, Influenza B, and RSV RNA. These etiologic agents are generally detectable in nasopharyngeal and nasal swabs during the ACUTE PHASE of infection. Hence, this test is intended to be performed on respiratory specimens collected from individuals with signs and symptoms of upper respiratory tract infection who meet Centers for Disease Control and Prevention (CDC) clinical and/or epidemiological criteria for Coronavirus Disease 2019 (COVID-19) testing. CDC COVID-19 criteria for testing on human specimens is available at [facility] webpage information for Healthcare Professionals: Coronavirus Disease 2019 (COVID-19) ([employer].com.au).     False-negative results may occur if the virus has genomic mutations, insertions, deletions, or rearrangements or if performed  very early in the course of illness. Otherwise, negative results indicate virus specific RNA targets are not detected, however negative results do not preclude SARS-CoV-2 infection/COVID-19, Influenza, or Respiratory syncytial virus infection. Results should not be used as the sole basis for patient management decisions. Negative results must be combined with clinical  observations, patient history, and epidemiological information. If upper respiratory tract infection is still suspected based on exposure history together with other clinical findings, re-testing should be considered.    Test methodology:   Cepheid Xpert Xpress SARS-CoV-2/Flu/RSV Assay real-time polymerase chain reaction (RT-PCR) test on the GeneXpert Dx and Xpert Xpress systems.   COMPREHENSIVE METABOLIC PANEL, NON-FASTING - Abnormal; Notable for the following components:    SODIUM 133 (*)     GLUCOSE 136 (*)     ALBUMIN/GLOBULIN RATIO 1.5 (*)     OSMOLALITY, CALCULATED 267 (*)     CALCIUM, CORRECTED 8.6 (*)     All other components within normal limits    Narrative:     Estimated Glomerular Filtration Rate (eGFR) is calculated using the CKD-EPI (2021) equation, intended for patients 84 years of age and older. If gender is not documented or unknown, there will be no eGFR calculation.     MAGNESIUM  - Abnormal; Notable for the following components:    MAGNESIUM  1.7 (*)     All other components within normal limits   CBC WITH DIFF - Abnormal; Notable for the following components:    WBC 3.7 (*)     MPV 7.8 (*)     NEUTROPHIL % 83 (*)     LYMPHOCYTE % 14 (*)     MONOCYTE % 3 (*)     EOSINOPHIL % 0 (*)     LYMPHOCYTE # 0.50 (*)     MONOCYTE # 0.10 (*)     All other components within normal limits   LACTIC ACID LEVEL W/ REFLEX FOR LEVEL >2.0 - Normal    Narrative:     Hemolysis may cause falsely elevated results.    PT/INR - Normal    Narrative:     In the setting of warfarin therapy, a moderate-intensity INR goal range is 2.0 to 3.0 and a high-intensity INR goal range is 2.5 to 3.5.    INR is ONLY validated to determine the level of anticoagulation with vitamin K antagonists (warfarin). Other factors may elevate the INR including but not limited to direct oral anticoagulants (DOACs), liver dysfunction, vitamin K deficiency, DIC, factor deficiencies, and factor inhibitors.   PTT (PARTIAL THROMBOPLASTIN TIME) - Normal    TROPONIN-I - Normal   URINALYSIS, MACROSCOPIC - Normal   URINALYSIS, MICROSCOPIC - Normal   CBC/DIFF    Narrative:     The following orders were created for panel order CBC/DIFF.  Procedure                               Abnormality         Status                     ---------                               -----------         ------                     CBC WITH IPQQ[[id number]]  Abnormal            Final result                 Please view results for these tests on the individual orders.   URINALYSIS, MACROSCOPIC AND MICROSCOPIC W/CULTURE REFLEX    Narrative:     The following orders were created for panel order URINALYSIS, MACROSCOPIC AND MICROSCOPIC W/CULTURE REFLEX [PRN ONLY].  Procedure                               Abnormality         Status                     ---------                               -----------         ------                     URINALYSIS, MACROSCOPIC[[id number]]      Normal              Final result               URINALYSIS, MICROSCOPIC[[id number]]      Normal              Final result                 Please view results for these tests on the individual orders.     XR CHEST PA AND LATERAL   Final Result by Edi, Radresults In (01/29 1334)   Left basilar airspace disease.         Radiologist location ID: [id number]           Medical Decision Making          Medical Decision Making  A 49 year old female presented with acute onset severe dizziness since the previous evening, following several days of illness that began on Sunday, including fever (resolved by Wednesday), and a three-day history of diarrhea. She has a history of hyponatremia and reported no cough, sputum production, or urinary symptoms. Examination revealed oxygen saturation above 95%, and chest x-ray demonstrated a left lower lobe opacity. Laboratory studies showed positive influenza A, mild hypomagnesemia, and mildly low sodium.  Patient was given 1 L of normal saline Rocephin  2 g of magnesium  replacement.  She  will be discharged with cefdinir  and albuterol  inhaler.  Her O2 saturation was above 95% on room air.  She will have close follow up with the primary care doctor.  Strict ER return precautions were given.  Patient out of the window for Tamiflu.    Differential diagnosis includes, but is not limited to:  - Pneumonia: Supported by chest x-ray showing left lower lobe opacity and recent respiratory symptoms, though without cough or sputum production.  - Influenza A: Confirmed by positive influenza A test and compatible symptoms including fever and dizziness.  - Hyponatremia: History of low sodium and recent diarrhea may contribute to current symptoms, though the exact sodium value was not specified.  - Hypomagnesemia: Mild hypomagnesemia was identified on laboratory testing and may contribute to dizziness.    Pneumonia, left lower lobe and Influenza A  - Discharged with cefdinir   - Advised to follow up with family doctor    Hyponatremia and Hypomagnesemia  -  Administered 1 liter of intravenous fluids and magnesium  replacement  - Advised to follow up with family doctor for monitoring  Medical Decision Making  Amount and/or Complexity of Data Reviewed  Labs: ordered.  Radiology: ordered. Decision-making details documented in ED Course.  ECG/medicine tests: independent interpretation performed.    Risk  OTC drugs.  Prescription drug management.      ED Course as of 09/29/24 1104   Thu Sep 28, 2024   1358 CBC/DIFF(!)  White blood cell count 3.7, hemoglobin 12.6, hematocrit 36.8, platelet count 208   1358 XR CHEST PA AND LATERAL  The heart size is normal.  The mediastinal contour is unremarkable.  Left basilar airspace opacities.   No significant effusions.        IMPRESSION:  Left basilar airspace disease.     1404 INFLUENZA VIRUS TYPE A(!): Detected   1409 LACTIC ACID: 1.2   1429 MAGNESIUM (!): 1.7   1429 TROPONIN-I: 3   1429 PROTHROMBIN TIME: 11.4   1429 INR: 1.01   1429 COMPREHENSIVE METABOLIC PANEL, NON-FASTING(!)  Na  133   1434 URINALYSIS, MACROSCOPIC AND MICROSCOPIC W/CULTURE REFLEX [PRN ONLY]  Unremarkable            Medications Ordered/Administered in the ED   NS bolus infusion 1,000 mL (0 mL Intravenous Stopped 09/28/24 1635)   cefTRIAXone  (ROCEPHIN ) 2 g in NS 50 mL IVPB with adaptor (0 g Intravenous Stopped 09/28/24 1634)   magnesium  oxide (MAG-OX) 400mg  (241.3 mg elemental magnesium ) tablet (400 mg Oral Given 09/28/24 1605)     Clinical Impression   Influenza A (Primary)   Pneumonia       Disposition: Discharged           This note was created with assistance from [employer]bridge via capture of conversational audio.  Consent was obtained from the patient prior to recording.

## 2024-09-28 NOTE — ED Nurses Note (Signed)

## 2024-10-10 ENCOUNTER — Ambulatory Visit: Admit: 2024-10-10

## 2024-10-10 ENCOUNTER — Encounter (HOSPITAL_COMMUNITY): Payer: Self-pay

## 2024-10-13 ENCOUNTER — Encounter (INDEPENDENT_AMBULATORY_CARE_PROVIDER_SITE_OTHER): Payer: Self-pay

## 2024-10-16 ENCOUNTER — Encounter (INDEPENDENT_AMBULATORY_CARE_PROVIDER_SITE_OTHER): Payer: Self-pay

## 2024-11-23 ENCOUNTER — Ambulatory Visit (HOSPITAL_COMMUNITY): Payer: Self-pay | Admitting: SLEEP MEDICINE
